# Patient Record
Sex: Female | Born: 1940 | Race: Black or African American | Hispanic: No | Marital: Single | Smoking: Current every day smoker
Health system: Southern US, Community
[De-identification: ages and names within clinical notes are randomized; demographics above are authoritative.]

## PROBLEM LIST (undated history)

## (undated) DIAGNOSIS — F419 Anxiety disorder, unspecified: Secondary | ICD-10-CM

## (undated) DIAGNOSIS — M1990 Unspecified osteoarthritis, unspecified site: Secondary | ICD-10-CM

## (undated) DIAGNOSIS — J449 Chronic obstructive pulmonary disease, unspecified: Secondary | ICD-10-CM

## (undated) DIAGNOSIS — J40 Bronchitis, not specified as acute or chronic: Secondary | ICD-10-CM

## (undated) DIAGNOSIS — I10 Essential (primary) hypertension: Secondary | ICD-10-CM

## (undated) DIAGNOSIS — F329 Major depressive disorder, single episode, unspecified: Secondary | ICD-10-CM

## (undated) DIAGNOSIS — F32A Depression, unspecified: Secondary | ICD-10-CM

## (undated) HISTORY — PX: CATARACT EXTRACTION, BILATERAL: SHX1313

---

## 2001-04-15 ENCOUNTER — Ambulatory Visit (HOSPITAL_COMMUNITY): Admission: RE | Admit: 2001-04-15 | Discharge: 2001-04-15 | Payer: Self-pay | Admitting: Unknown Physician Specialty

## 2001-04-15 ENCOUNTER — Encounter: Payer: Self-pay | Admitting: Unknown Physician Specialty

## 2001-11-16 ENCOUNTER — Ambulatory Visit (HOSPITAL_COMMUNITY): Admission: RE | Admit: 2001-11-16 | Discharge: 2001-11-16 | Payer: Self-pay | Admitting: Unknown Physician Specialty

## 2001-11-16 ENCOUNTER — Encounter: Payer: Self-pay | Admitting: Unknown Physician Specialty

## 2001-12-22 ENCOUNTER — Ambulatory Visit (HOSPITAL_COMMUNITY): Admission: RE | Admit: 2001-12-22 | Discharge: 2001-12-22 | Payer: Self-pay | Admitting: Unknown Physician Specialty

## 2001-12-22 ENCOUNTER — Encounter: Payer: Self-pay | Admitting: Unknown Physician Specialty

## 2002-06-03 ENCOUNTER — Ambulatory Visit (HOSPITAL_COMMUNITY): Admission: RE | Admit: 2002-06-03 | Discharge: 2002-06-03 | Payer: Self-pay | Admitting: General Surgery

## 2002-06-08 ENCOUNTER — Encounter: Payer: Self-pay | Admitting: General Surgery

## 2002-06-08 ENCOUNTER — Ambulatory Visit (HOSPITAL_COMMUNITY): Admission: RE | Admit: 2002-06-08 | Discharge: 2002-06-08 | Payer: Self-pay | Admitting: General Surgery

## 2004-01-31 ENCOUNTER — Ambulatory Visit: Payer: Self-pay | Admitting: Orthopedic Surgery

## 2004-02-12 ENCOUNTER — Ambulatory Visit: Payer: Self-pay | Admitting: Orthopedic Surgery

## 2005-03-24 ENCOUNTER — Ambulatory Visit (HOSPITAL_COMMUNITY): Admission: RE | Admit: 2005-03-24 | Discharge: 2005-03-24 | Payer: Self-pay | Admitting: Internal Medicine

## 2005-06-17 ENCOUNTER — Ambulatory Visit (HOSPITAL_COMMUNITY): Admission: RE | Admit: 2005-06-17 | Discharge: 2005-06-17 | Payer: Self-pay | Admitting: Internal Medicine

## 2008-01-07 HISTORY — PX: TOTAL KNEE ARTHROPLASTY: SHX125

## 2008-01-11 ENCOUNTER — Inpatient Hospital Stay (HOSPITAL_COMMUNITY): Admission: EM | Admit: 2008-01-11 | Discharge: 2008-01-19 | Payer: Self-pay | Admitting: Emergency Medicine

## 2008-01-20 ENCOUNTER — Emergency Department (HOSPITAL_COMMUNITY): Admission: EM | Admit: 2008-01-20 | Discharge: 2008-01-20 | Payer: Self-pay | Admitting: Emergency Medicine

## 2008-01-31 ENCOUNTER — Ambulatory Visit (HOSPITAL_COMMUNITY): Admission: RE | Admit: 2008-01-31 | Discharge: 2008-01-31 | Payer: Self-pay | Admitting: Internal Medicine

## 2008-06-26 ENCOUNTER — Ambulatory Visit (HOSPITAL_COMMUNITY): Admission: RE | Admit: 2008-06-26 | Discharge: 2008-06-26 | Payer: Self-pay | Admitting: Internal Medicine

## 2008-07-04 ENCOUNTER — Ambulatory Visit (HOSPITAL_COMMUNITY): Admission: RE | Admit: 2008-07-04 | Discharge: 2008-07-04 | Payer: Self-pay | Admitting: Internal Medicine

## 2008-07-11 ENCOUNTER — Inpatient Hospital Stay (HOSPITAL_COMMUNITY): Admission: RE | Admit: 2008-07-11 | Discharge: 2008-07-14 | Payer: Self-pay | Admitting: Orthopedic Surgery

## 2010-04-14 LAB — BASIC METABOLIC PANEL
CO2: 28 mEq/L (ref 19–32)
CO2: 29 mEq/L (ref 19–32)
Calcium: 8.5 mg/dL (ref 8.4–10.5)
Chloride: 104 mEq/L (ref 96–112)
Chloride: 104 mEq/L (ref 96–112)
Creatinine, Ser: 0.79 mg/dL (ref 0.4–1.2)
Creatinine, Ser: 0.88 mg/dL (ref 0.4–1.2)
GFR calc Af Amer: >60 mL/min (ref >60)
GFR calc Af Amer: >60 mL/min (ref >60)
Glucose, Bld: 121 mg/dL — ABNORMAL HIGH (ref 70–99)
Sodium: 137 mEq/L (ref 135–145)
Sodium: 137 mEq/L (ref 135–145)

## 2010-04-14 LAB — CBC
Hemoglobin: 11 g/dL — ABNORMAL LOW (ref 12.0–15.0)
Hemoglobin: 11.8 g/dL — ABNORMAL LOW (ref 12.0–15.0)
MCHC: 33.5 g/dL (ref 30.0–36.0)
MCHC: 33.8 g/dL (ref 30.0–36.0)
MCV: 83.7 fL (ref 78.0–100.0)
MCV: 85.5 fL (ref 78.0–100.0)
RBC: 3.9 MIL/uL (ref 3.87–5.11)
RBC: 4.12 MIL/uL (ref 3.87–5.11)
RDW: 16.7 % — ABNORMAL HIGH (ref 11.5–15.5)
WBC: 12.7 10*3/uL — ABNORMAL HIGH (ref 4.0–10.5)

## 2010-04-14 LAB — TYPE AND SCREEN

## 2010-04-15 LAB — URINALYSIS, ROUTINE W REFLEX MICROSCOPIC
Bilirubin Urine: NEGATIVE
Ketones, ur: NEGATIVE mg/dL
Nitrite: NEGATIVE
Protein, ur: NEGATIVE mg/dL
pH: 6 (ref 5.0–8.0)

## 2010-04-15 LAB — APTT: aPTT: 30 seconds (ref 24–37)

## 2010-04-15 LAB — DIFFERENTIAL
Basophils Absolute: 0.3 10*3/uL — ABNORMAL HIGH (ref 0.0–0.1)
Basophils Relative: 2 % — ABNORMAL HIGH (ref 0–1)
Lymphocytes Relative: 21 % (ref 12–46)
Monocytes Absolute: 0.5 10*3/uL (ref 0.1–1.0)
Neutro Abs: 8.8 10*3/uL — ABNORMAL HIGH (ref 1.7–7.7)
Neutrophils Relative %: 73 % (ref 43–77)

## 2010-04-15 LAB — BASIC METABOLIC PANEL
CO2: 27 mEq/L (ref 19–32)
Calcium: 9.7 mg/dL (ref 8.4–10.5)
Creatinine, Ser: 1.06 mg/dL (ref 0.4–1.2)
GFR calc non Af Amer: 51 mL/min — ABNORMAL LOW (ref >60)
Glucose, Bld: 157 mg/dL — ABNORMAL HIGH (ref 70–99)

## 2010-04-15 LAB — CBC
MCHC: 33.5 g/dL (ref 30.0–36.0)
Platelets: 424 10*3/uL — ABNORMAL HIGH (ref 150–400)
RDW: 16.7 % — ABNORMAL HIGH (ref 11.5–15.5)

## 2010-04-15 LAB — URINE MICROSCOPIC-ADD ON

## 2010-04-15 LAB — PROTIME-INR
INR: 1 (ref 0.00–1.49)
Prothrombin Time: 13 seconds (ref 11.6–15.2)

## 2010-04-22 LAB — CBC
HCT: 31.1 % — ABNORMAL LOW (ref 36.0–46.0)
HCT: 32.6 % — ABNORMAL LOW (ref 36.0–46.0)
HCT: 43 % (ref 36.0–46.0)
Hemoglobin: 10.4 g/dL — ABNORMAL LOW (ref 12.0–15.0)
Hemoglobin: 10.5 g/dL — ABNORMAL LOW (ref 12.0–15.0)
Hemoglobin: 11.2 g/dL — ABNORMAL LOW (ref 12.0–15.0)
Hemoglobin: 11.7 g/dL — ABNORMAL LOW (ref 12.0–15.0)
Hemoglobin: 14.5 g/dL (ref 12.0–15.0)
MCHC: 33.5 g/dL (ref 30.0–36.0)
MCHC: 33.7 g/dL (ref 30.0–36.0)
MCHC: 34.2 g/dL (ref 30.0–36.0)
MCV: 85.3 fL (ref 78.0–100.0)
MCV: 85.4 fL (ref 78.0–100.0)
MCV: 85.7 fL (ref 78.0–100.0)
MCV: 86.4 fL (ref 78.0–100.0)
MCV: 86.8 fL (ref 78.0–100.0)
Platelets: 439 10*3/uL — ABNORMAL HIGH (ref 150–400)
Platelets: 481 10*3/uL — ABNORMAL HIGH (ref 150–400)
RBC: 3.6 MIL/uL — ABNORMAL LOW (ref 3.87–5.11)
RBC: 3.63 MIL/uL — ABNORMAL LOW (ref 3.87–5.11)
RBC: 4.06 MIL/uL (ref 3.87–5.11)
RBC: 4.07 MIL/uL (ref 3.87–5.11)
RDW: 14.8 % (ref 11.5–15.5)
RDW: 14.8 % (ref 11.5–15.5)
RDW: 15.2 % (ref 11.5–15.5)
WBC: 16.6 10*3/uL — ABNORMAL HIGH (ref 4.0–10.5)
WBC: 18.4 10*3/uL — ABNORMAL HIGH (ref 4.0–10.5)

## 2010-04-22 LAB — BASIC METABOLIC PANEL
BUN: 3 mg/dL — ABNORMAL LOW (ref 6–23)
BUN: 5 mg/dL — ABNORMAL LOW (ref 6–23)
BUN: 6 mg/dL (ref 6–23)
BUN: 7 mg/dL (ref 6–23)
CO2: 27 mEq/L (ref 19–32)
CO2: 27 mEq/L (ref 19–32)
Calcium: 8.4 mg/dL (ref 8.4–10.5)
Calcium: 8.7 mg/dL (ref 8.4–10.5)
Chloride: 102 mEq/L (ref 96–112)
Chloride: 104 mEq/L (ref 96–112)
Chloride: 104 mEq/L (ref 96–112)
Creatinine, Ser: 0.75 mg/dL (ref 0.4–1.2)
Creatinine, Ser: 0.77 mg/dL (ref 0.4–1.2)
GFR calc Af Amer: >60 mL/min (ref >60)
GFR calc Af Amer: >60 mL/min (ref >60)
GFR calc Af Amer: >60 mL/min (ref >60)
GFR calc non Af Amer: 60 mL/min — ABNORMAL LOW (ref >60)
GFR calc non Af Amer: >60 mL/min (ref >60)
GFR calc non Af Amer: >60 mL/min (ref >60)
GFR calc non Af Amer: >60 mL/min (ref >60)
Glucose, Bld: 111 mg/dL — ABNORMAL HIGH (ref 70–99)
Glucose, Bld: 149 mg/dL — ABNORMAL HIGH (ref 70–99)
Potassium: 3 mEq/L — ABNORMAL LOW (ref 3.5–5.1)
Potassium: 3.6 mEq/L (ref 3.5–5.1)
Potassium: 4.5 mEq/L (ref 3.5–5.1)
Sodium: 137 mEq/L (ref 135–145)
Sodium: 138 mEq/L (ref 135–145)
Sodium: 139 mEq/L (ref 135–145)

## 2010-04-22 LAB — DIFFERENTIAL
Basophils Absolute: 0 10*3/uL (ref 0.0–0.1)
Basophils Absolute: 0 10*3/uL (ref 0.0–0.1)
Basophils Absolute: 0 10*3/uL (ref 0.0–0.1)
Basophils Relative: 0 % (ref 0–1)
Basophils Relative: 0 % (ref 0–1)
Basophils Relative: 0 % (ref 0–1)
Basophils Relative: 1 % (ref 0–1)
Eosinophils Absolute: 0 10*3/uL (ref 0.0–0.7)
Eosinophils Absolute: 0 10*3/uL (ref 0.0–0.7)
Eosinophils Absolute: 0.1 10*3/uL (ref 0.0–0.7)
Eosinophils Absolute: 0.1 10*3/uL (ref 0.0–0.7)
Eosinophils Absolute: 0.3 10*3/uL (ref 0.0–0.7)
Eosinophils Relative: 0 % (ref 0–5)
Eosinophils Relative: 0 % (ref 0–5)
Eosinophils Relative: 0 % (ref 0–5)
Eosinophils Relative: 1 % (ref 0–5)
Eosinophils Relative: 2 % (ref 0–5)
Lymphocytes Relative: 20 % (ref 12–46)
Lymphocytes Relative: 8 % — ABNORMAL LOW (ref 12–46)
Lymphs Abs: 1.5 10*3/uL (ref 0.7–4.0)
Monocytes Absolute: 0.1 10*3/uL (ref 0.1–1.0)
Monocytes Absolute: 0.2 10*3/uL (ref 0.1–1.0)
Monocytes Absolute: 0.4 10*3/uL (ref 0.1–1.0)
Monocytes Relative: 1 % — ABNORMAL LOW (ref 3–12)
Monocytes Relative: 4 % (ref 3–12)
Neutro Abs: 10 10*3/uL — ABNORMAL HIGH (ref 1.7–7.7)
Neutro Abs: 18.3 10*3/uL — ABNORMAL HIGH (ref 1.7–7.7)
Neutrophils Relative %: 74 % (ref 43–77)
Neutrophils Relative %: 81 % — ABNORMAL HIGH (ref 43–77)
Neutrophils Relative %: 86 % — ABNORMAL HIGH (ref 43–77)
WBC Morphology: INCREASED

## 2010-04-22 LAB — BLOOD GAS, ARTERIAL
Acid-Base Excess: 5.1 mmol/L — ABNORMAL HIGH (ref 0.0–2.0)
Acid-Base Excess: 0.8 mmol/L (ref 0.0–2.0)
Acid-base deficit: 0.3 mmol/L (ref 0.0–2.0)
Bicarbonate: 23.3 mEq/L (ref 20.0–24.0)
Bicarbonate: 23.9 mEq/L (ref 20.0–24.0)
Bicarbonate: 29.6 mEq/L — ABNORMAL HIGH (ref 20.0–24.0)
O2 Content: 3 L/min
O2 Content: 3 L/min
O2 Saturation: 91.7 %
O2 Saturation: 93.6 %
O2 Saturation: 95.5 %
O2 Saturation: 95.2 %
Patient temperature: 37
Patient temperature: 37
TCO2: 26.6 mmol/L (ref 0–100)
TCO2: 21.5 mmol/L (ref 0–100)
pCO2 arterial: 37.8 mmHg (ref 35.0–45.0)
pCO2 arterial: 48 mmHg — ABNORMAL HIGH (ref 35.0–45.0)
pH, Arterial: 7.406 — ABNORMAL HIGH (ref 7.350–7.400)
pO2, Arterial: 64.1 mmHg — ABNORMAL LOW (ref 80.0–100.0)
pO2, Arterial: 82.1 mmHg (ref 80.0–100.0)

## 2010-04-22 LAB — COMPREHENSIVE METABOLIC PANEL
ALT: 44 U/L — ABNORMAL HIGH (ref 0–35)
AST: 38 U/L — ABNORMAL HIGH (ref 0–37)
Alkaline Phosphatase: 198 U/L — ABNORMAL HIGH (ref 39–117)
BUN: 9 mg/dL (ref 6–23)
CO2: 29 mEq/L (ref 19–32)
Chloride: 100 mEq/L (ref 96–112)
Chloride: 101 mEq/L (ref 96–112)
Creatinine, Ser: 0.81 mg/dL (ref 0.4–1.2)
GFR calc Af Amer: >60 mL/min (ref >60)
GFR calc non Af Amer: >60 mL/min (ref >60)
Glucose, Bld: 150 mg/dL — ABNORMAL HIGH (ref 70–99)
Glucose, Bld: 166 mg/dL — ABNORMAL HIGH (ref 70–99)
Potassium: 3.3 mEq/L — ABNORMAL LOW (ref 3.5–5.1)
Total Bilirubin: 0.4 mg/dL (ref 0.3–1.2)
Total Bilirubin: 0.6 mg/dL (ref 0.3–1.2)
Total Protein: 8 g/dL (ref 6.0–8.3)

## 2010-04-22 LAB — D-DIMER, QUANTITATIVE: D-Dimer, Quant: 2.7 ug/mL-FEU — ABNORMAL HIGH (ref 0.00–0.48)

## 2010-05-21 NOTE — H&P (Signed)
NAME:  Kathryn Long, Kathryn Long            ACCOUNT NO.:  [id number]   MEDICAL RECORD NO.:  [id number]          PATIENT TYPE:  INP   LOCATION:  A338                          FACILITY:  [facility]   PHYSICIAN:  Mila Homer. Sudie Bailey, M.D.DATE OF BIRTH:  1940-12-08   DATE OF ADMISSION:  01/11/2008  DATE OF DISCHARGE:  LH                              HISTORY & PHYSICAL   This 70 year old patient of Dr. Felecia Shelling was brought to the emergency room.  According her daughter, she has been coughing up a clearish sputum for  about 8 days gradually getting worse during this time.  She has had  fever and chills.  One time she had nausea and vomiting.  She had been  getting weaker.   She is currently on no medication.  She smoked since age 70/13,  currently smoking about a third to pack a day.  She complained of pains  to the abdomen and chest along with her symptomatology.  She really  finds very difficult to give history because she is very dyspneic and  using oxygen.   Admission exam showed a cooperative elderly woman whose blood pressure  is 159/84, the pulse 106, respiratory rate 20, temperature 97.1.  She  was on O2.  Mucous membranes are moist.  She appeared to be alert and  oriented, but again very dyspneic.  There are negative axillary and  supraclavicular nodes.  Lungs show decreased breath sounds throughout,  some prolonged expirations, mild wheezing particularly posteriorly.  There are mild intercostal retractions, mild use of accessory muscles  with respiration.  Heart had a regular rhythm, rate about 100, heart  sounds faint.  The abdomen was soft without organomegaly or mass or  tenderness.  There is no edema of the ankles.  Skin turgor was normal.   Her admission white cell count was 21,000 of which 87% neutrophils, 11  lymphs, hemoglobin 14.5.  Her CMP showed a potassium of 3.3, glucose  166, albumin 3.3, alk phos 198.  Her atypical lymphocytes and  mononuclear cells noted.   Chest x-ray showed  in the past medially the left infrahilar region,  which appeared to be posteriorly position, but lateral view consistent  with pneumonia involving the superior segment of left lower lobe,  neoplasm could not be excluded.  The heart is mildly enlarged.  There  were degenerative changes of the thoracic spine.   ADMISSION DIAGNOSES:  1. Presumptive pneumonia.  2. Tobacco use disorder.  3. Hypokalemia.  4. Hyperglycemia.  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   PLAN/TREATMENT:  Rocephin 1 g IV and Zithromax 500 mg IV.  She will have  an IV of normal saline 100 mL an hour in a regular diet.  She will be on  prophylactic Lovenox 40 mg subcu.  D-Dimer is pending and we will put  her on an NicoDerm 14 mg patch daily with O2 at 2 L and albuterol and  ipratropium neb treatments q.4 h. while awake q.2 h. p.r.n.  I will add  potassium KCl, potassium chloride 20 mEq daily.  We will recheck a CBC,  BMP in the morning.  Pulse ox to make sure O2 sats greater than 90%.  Discussed all this with the patient and her daughter.      Mila Homer. Sudie Bailey, M.D.  Electronically Signed     SDK/MEDQ  D:  01/11/2008  T:  01/12/2008  Job:  [id number]

## 2010-05-21 NOTE — H&P (Signed)
NAME:  Kathryn Long, Kathryn Long            ACCOUNT NO.:  [id number]   MEDICAL RECORD NO.:  [id number]           PATIENT TYPE:   LOCATION:                                 FACILITY:   PHYSICIAN:  Madlyn Frankel. Charlann Boxer, M.D.  DATE OF BIRTH:  [birth year]   DATE OF ADMISSION:  07/11/2008  DATE OF DISCHARGE:                              HISTORY & PHYSICAL   PROCEDURE:  Right total knee replacement.   CHIEF COMPLAINT:  Right knee pain.   HISTORY OF PRESENT ILLNESS:  A 70 year old female with a history of  right knee pain secondary to osteoarthritis.  It is refractory to all  conservative treatment.   PRIMARY CARE PHYSICIAN:  Tesfaye D. Felecia Shelling, MD   PAST MEDICAL HISTORY:  1. Osteoarthritis.  2. Depression.  3. Cataracts.   PAST SURGICAL HISTORY:  None.   FAMILY HISTORY:  Diabetes, coronary artery disease, cancer.   SOCIAL HISTORY:  She is a smoker, smokes 1 pack per day for the past 59  years.  Primary caregiver is going to be her daughter, Kathryn Long,  postoperatively.   DRUG ALLERGIES:  No known drug allergies.   CURRENT MEDICATIONS:  1. Ultracet p.r.n.  2. Lexapro.  3. Xanax.  4. Celebrex 200 mg p.o. b.i.d. x2 weeks after surgery.   REVIEW OF SYSTEMS:  RESPIRATORY:  She has sore throat on exertion.  GI:  She has constipation.  MUSCULOSKELETAL:  She has joint pain, joint  swelling, muscular pain.  Otherwise see HPI.   PHYSICAL EXAMINATION:  VITAL SIGNS:  Pulse 72, respirations 16, blood  pressure 174/86 sitting left arm.  GENERAL:  Awake, alert and oriented.  HEENT:  Normocephalic.  NECK:  Supple.  No carotid bruits.  CHEST:  Lungs clear to auscultation bilaterally.  BREASTS:  Deferred.  HEART:  S1, S2 distinct.  ABDOMEN:  Soft, nontender.  Bowel sounds present.  PELVIS:  Stable.  GENITOURINARY:  Deferred.  EXTREMITIES:  Right knee in varus alignment.  SKIN:  No cellulitis.  NEUROLOGICAL:  Intact distal sensibilities.   LABORATORY DATA:  Labs, EKG, chest x-ray pending.   IMPRESSION:  Right knee osteoarthritis.   PLAN OF ACTION:  Right total knee replacement by surgeon, Dr. Madlyn Frankel.  Charlann Boxer, at [facility] on July 11, 2008.  Risks and complications  were discussed.  Postoperative medications were provided including  aspirin for DVT prophylaxis.     ______________________________  Yetta Glassman [address], [place]      Madlyn Frankel. Charlann Boxer, M.D.  Electronically Signed    BLM/MEDQ  D:  06/30/2008  T:  06/30/2008  Job:  [id number]   cc:   Tesfaye D. Felecia Shelling, MD  Fax: [phone]

## 2010-05-21 NOTE — Op Note (Signed)
NAMEMarland Kitchen  KLOEY, CAZAREZ            ACCOUNT NO.:  [id number]   MEDICAL RECORD NO.:  [id number]          PATIENT TYPE:  INP   LOCATION:  1607                         FACILITY:  [facility]   PHYSICIAN:  Madlyn Frankel. Charlann Boxer, M.D.  DATE OF BIRTH:  1940/08/10   DATE OF PROCEDURE:  07/11/2008  DATE OF DISCHARGE:                               OPERATIVE REPORT   PREOPERATIVE DIAGNOSIS:  Right knee osteoarthritis.   POSTOPERATIVE DIAGNOSIS:  Right knee osteoarthritis.   PROCEDURE:  Right total knee replacement.   COMPONENTS USED:  DePuy rotating platform posterior stabilized knee  system, size 2.5-femur, 2.5-tibia, with 12.5-insert to match the 2.5-  femur and 35-patellar button.   SURGEON:  Dr. Charlann Boxer.   ASSISTANT:  Dwyane Luo, PA-C.   ANESTHESIA:  Spinal.   BLOOD LOSS:  Minimal.   TOURNIQUET TIME:  38 minutes at 250 mmHg.   DRAINS:  One Hemovac.   SPECIMENS:  None.   COMPLICATIONS:  None.   INDICATION FOR PROCEDURE:  Ms. Susann Givens is a 70 year old female who  presented to the office with bilateral knee pain, right greater than  left.  Radiographs revealed end-stage bone-on-bone changes.   Evaluating the radiographs and trying conservative measures, she failed  to respond in a satisfactory way.  Quality of life was diminished.  We  discussed the risks and benefits, pros and cons of knee replacement  surgery, including the risk of infection, DVT, component failure, need  for revision, including potential for manipulation due to stiffness,  postop course, and expectation.  Consent was obtained for the benefit of  pain relief for right total knee.   PROCEDURE IN DETAIL:  The patient was brought to the operating theater.  Once adequate anesthesia and preoperative antibiotics, Ancef, were  administered the patient was positioned supine and the right thigh  tourniquet placed.  Right lower extremity was prescrubbed, prepped and  draped in sterile fashion.   The time-out was performed  identifying the patient and the procedure and  extremity.  The leg was exsanguinated and tourniquet elevated to 2 mmHg.  A midline incision was made, followed by a median parapatellar  arthrotomy.  Following initial debridement attention was directed to the  patella and precut measurement was noted to be 20-40 mm.  I resected  down to 14 mm and chose to use a 35-patellar button due to the cup size.  A metal shim was placed in the lug holes that had been drilled.  Attention was now directed to the femur and femoral retractors were  placed and a drill was used to open up the femur.  I irrigated the femur  out to prevent fat emboli and then placed intramedullary rod at 3  degrees of valgus.  I resected 10 mm of bone off the distal femur.   Following this attention was directed to the tibia.  I utilized the  extramedullary guide and then made a resection of 10 mm about the  proximal lateral tibia.  We checked the cut surface to make sure it was  perpendicular as well as the 10-mm extension block.   Once I was able to  determine a coronal plane cut was perpendicular, I  cut the rotation of the femur based off this proximal tibia cut.  It was  felt the femur would be a size 2.5.  The 2.5-rotation block was then  pinned into position with a C clamp.  I then exchanged this out with a 4-  and-1 cutting block and made the anterior-posterior chamfer cuts.  This  was done without notching or complication.  The box cut was made for the  distal bone forearm and lateral aspect of distal femur.  Attention was  now redirected back to the tibia and subluxated anteriorly.  The cut  surface seemed to fit best in the 2.5-tibia.  This was pinned into  position, drilled and keel punched.  Trial reduction was carried out  with the return tibia with 12.5-insert and the knee came to full  extension with stable ligaments from extension and flexion with the  patella tracking through the trochlea without application  pressure.  All  trial components were removed and the final components were opened.  Cement was mixed and the knee was injected at the synovial capsule  junction with 0.25% Marcaine with epinephrine and 1 mL of Toradol.  The  knee was irrigated with normal saline solution.  Once the cement was  ready and the cut surfaces dried, the final components were cemented  into position.  Extruded cement was removed as the knee was brought to  extension with a 12.5-insert.  Once the cement had cured excessive  cement was removed throughout the knee.  Once I was satisfied there was  no remaining cement into the knee, the final 12.5-insert matched and  then femur was inserted.  The knee was reirrigated and a medium Hemovac  drain was placed deep.  The extensor mechanism was then reapproximated  using 1-Vicryl.  The tourniquet was let down at 38 minutes.  The  remainder of the wound was closed with 2-0 Vicryl and running Monocryl  and the wound was cleaned, dried and dressed sterilely with Steri-Strips  and bulky sterile wrap.  She was brought to recovery in stable condition  having tolerating the procedure well.      Madlyn Frankel Charlann Boxer, M.D.  Electronically Signed     MDO/MEDQ  D:  07/11/2008  T:  07/11/2008  Job:  [id number]

## 2010-05-24 NOTE — Discharge Summary (Signed)
NAME:  Kathryn Long, Kathryn Long            ACCOUNT NO.:  [id number]   MEDICAL RECORD NO.:  [id number]          PATIENT TYPE:  INP   LOCATION:  A338                          FACILITY:  [facility]   PHYSICIAN:  Tesfaye D. Felecia Shelling, MD   DATE OF BIRTH:  [birth year]   DATE OF ADMISSION:  01/11/2008  DATE OF DISCHARGE:  01/13/2010LH                               DISCHARGE SUMMARY   DISCHARGE DIAGNOSES:  1. Pneumonia.  2. Pulmonary nodules, etiology not clear.  3. Hypokalemia.  4. Dyspepsia.  5. Constipation.  6. Back pain.  7. Probable chronic obstructive pulmonary disease.   DISCHARGE MEDICATIONS:  1. Augmentin 500 mg p.o. t.i.d.  2. Combivent inhaler two puffs p.o. t.i.d.  3. Mucinex 600 mg one tablet p.o. q.12 h p.r.n.  4. Tylenol 650 one tablet p.o. q.6 h p.r.n.   DISPOSITION:  The patient was discharged to home in stable condition.   HOSPITAL COURSE:  This is a 70 year old female patient who was admitted  on January 11, 2008 due to cough and shortness of breath.  Her chest x-  ray showed signs of pneumonia and pleural effusion.  The patient has a  history of long-term tobacco abuse.  She was admitted and started on IV  antibiotics.  However, the patient also started having nausea, vomiting  and difficulty breathing with wheezing.  She had symptoms compatible  with chronic obstructive pulmonary disease.  She was started on  nebulizer treatment and IV steroids.  The patient was continued on IV  antibiotics.  Over the hospital stay the patient gradually improved and  she was discharged to home to be followed as an outpatient.      Tesfaye D. Felecia Shelling, MD  Electronically Signed     TDF/MEDQ  D:  02/18/2008  T:  02/18/2008  Job:  [id number]

## 2010-05-24 NOTE — Discharge Summary (Signed)
NAMEMarland Long  TALIAH, PORCHE            ACCOUNT NO.:  [id number]   MEDICAL RECORD NO.:  [id number]          PATIENT TYPE:  INP   LOCATION:  1607                         [facility]ACILITY:  [facility]   PHYSICIAN:  Madlyn Frankel. Charlann Boxer, M.D.  DATE OF BIRTH:  January 02, 1941   DATE OF ADMISSION:  07/11/2008  DATE OF DISCHARGE:  07/14/2008                               DISCHARGE SUMMARY   ADMITTING DIAGNOSES:  1. Osteoarthritis.  2. Depression.  3. Cataracts.   DISCHARGE DIAGNOSES:  1. Osteoarthritis.  2. Depression.  3. Cataracts.   The patient is a 70 year old female with history of right knee pain  secondary to osteoarthritis refractory to surgery.   PROCEDURE:  Right total knee replacement.  Surgeon - Dr. Durene Romans.  Assistant - Dwyane Luo, PA-C.   CONSULTATIONS:  None.   LABORATORY DATA:  CBC final reading - white blood cells 11.7, hematocrit  32.6, platelets 332.  Metabolic - sodium 137, potassium 3.3, BUN 8,  creatinine 0.79, glucose __________.   HOSPITAL COURSE:  The patient was admitted to the hospital for a right  total knee replacement and admitted to the orthopedic floor.  Her stay  was unremarkable.  She remained hemodynamically orthopedically stable.  She did have some pain control issues which were modified by replacing  Darvocet with oxycodone.  Oxycodone was sufficient for pain control.  The dressing was changed on day two.  No significant drainage from the  wound.  She remained neurovascularly intact of her right lower extremity  with improving quad function throughout.  She was weightbearing as  tolerated with physical therapy.  By day three, she had met all criteria  for discharge home with pain well-controlled with oxycodone, in stable  and improved condition.   DISCHARGE DISPOSITION:  Discharged home in stable and improved condition  with home health care PT.   DISCHARGE PHYSICAL THERAPY:  Weightbearing as tolerated.  Goals of range  of motion zero to 120 by 6 weeks.   DISCHARGE DIET:  Heart-healthy.   WOUND CARE:  Keep dry.   DISCHARGE MEDICATIONS:  1. Oxycodone 5 mg one to three p.o. q.3-4h. p.r.n. pain.  2. Tylenol 1000 mg p.o. q.6h.  3. Robaxin 500 mg one p.o. q.6h.  4. Iron 325 mg p.o. t.i.d.  5. Colace 100 grams p.o. b.i.d.  6. MiraLax 17 grams p.o. daily.  7. Aspirin 325 mg p.o. b.i.d. x6 weeks.  8. Lisinopril HCTZ one p.o. daily.  9. Combivent two puffs up to four times daily.  10.Requip 1 mg one t.i.d.  11.Lexapro 20 mg p.o. daily.  12.Alprazolam 0.5 mg p.o. b.i.d.   DISCHARGE FOLLOWUP:  Follow up at phone number [phone] with Dr. Charlann Boxer  for a 2-week wound check.     ______________________________  Yetta Glassman. [address], [place]      Madlyn Frankel. Charlann Boxer, M.D.  Electronically Signed    BLM/MEDQ  D:  07/27/2008  T:  07/27/2008  Job:  [id number]   cc:   Tesfaye D. Felecia Shelling, MD  Fax: [phone]

## 2011-04-08 ENCOUNTER — Ambulatory Visit (HOSPITAL_COMMUNITY)
Admission: RE | Admit: 2011-04-08 | Discharge: 2011-04-08 | Disposition: A | Discharge status: Home / Self Care | Payer: Medicare Other | Source: Ambulatory Visit | Attending: Internal Medicine | Admitting: Internal Medicine

## 2011-04-08 ENCOUNTER — Other Ambulatory Visit (HOSPITAL_COMMUNITY): Payer: Self-pay | Admitting: Internal Medicine

## 2011-04-08 DIAGNOSIS — M25511 Pain in right shoulder: Secondary | ICD-10-CM

## 2011-04-08 DIAGNOSIS — M25519 Pain in unspecified shoulder: Secondary | ICD-10-CM | POA: Insufficient documentation

## 2011-09-02 ENCOUNTER — Other Ambulatory Visit (HOSPITAL_COMMUNITY): Payer: Self-pay | Admitting: Internal Medicine

## 2011-09-02 DIAGNOSIS — Z139 Encounter for screening, unspecified: Secondary | ICD-10-CM

## 2011-09-15 ENCOUNTER — Ambulatory Visit (HOSPITAL_COMMUNITY): Payer: Medicare Other

## 2011-10-15 ENCOUNTER — Telehealth: Payer: Self-pay

## 2011-10-15 NOTE — Telephone Encounter (Signed)
Called pt. She does not want to do at this time. She is not having any problems. She needs to check on transportation and she is just not ready at this time. She will call when she is ready. Sending a letter to Dr. Felecia Shelling.

## 2011-11-04 ENCOUNTER — Other Ambulatory Visit (HOSPITAL_COMMUNITY): Payer: Self-pay | Admitting: Internal Medicine

## 2011-11-04 DIAGNOSIS — Z139 Encounter for screening, unspecified: Secondary | ICD-10-CM

## 2011-11-07 ENCOUNTER — Ambulatory Visit (HOSPITAL_COMMUNITY)
Admission: RE | Admit: 2011-11-07 | Discharge: 2011-11-07 | Disposition: A | Discharge status: Home / Self Care | Payer: Medicare Other | Source: Ambulatory Visit | Attending: Internal Medicine | Admitting: Internal Medicine

## 2011-11-07 DIAGNOSIS — Z139 Encounter for screening, unspecified: Secondary | ICD-10-CM

## 2011-11-07 DIAGNOSIS — Z1231 Encounter for screening mammogram for malignant neoplasm of breast: Secondary | ICD-10-CM | POA: Insufficient documentation

## 2011-11-12 ENCOUNTER — Other Ambulatory Visit: Payer: Self-pay | Admitting: Internal Medicine

## 2011-11-12 DIAGNOSIS — R928 Other abnormal and inconclusive findings on diagnostic imaging of breast: Secondary | ICD-10-CM

## 2011-11-26 ENCOUNTER — Ambulatory Visit (HOSPITAL_COMMUNITY)
Admission: RE | Admit: 2011-11-26 | Discharge: 2011-11-26 | Disposition: A | Discharge status: Home / Self Care | Payer: Medicare Other | Source: Ambulatory Visit | Attending: Internal Medicine | Admitting: Internal Medicine

## 2011-11-26 ENCOUNTER — Other Ambulatory Visit: Payer: Self-pay | Admitting: Internal Medicine

## 2011-11-26 DIAGNOSIS — R928 Other abnormal and inconclusive findings on diagnostic imaging of breast: Secondary | ICD-10-CM

## 2012-11-19 ENCOUNTER — Other Ambulatory Visit (HOSPITAL_COMMUNITY): Payer: Self-pay | Admitting: Internal Medicine

## 2012-11-19 DIAGNOSIS — Z139 Encounter for screening, unspecified: Secondary | ICD-10-CM

## 2012-11-26 ENCOUNTER — Ambulatory Visit (HOSPITAL_COMMUNITY)
Admission: RE | Admit: 2012-11-26 | Discharge: 2012-11-26 | Disposition: A | Discharge status: Home / Self Care | Payer: Medicare Other | Source: Ambulatory Visit | Attending: Internal Medicine | Admitting: Internal Medicine

## 2012-11-26 DIAGNOSIS — Z139 Encounter for screening, unspecified: Secondary | ICD-10-CM

## 2012-11-26 DIAGNOSIS — Z1231 Encounter for screening mammogram for malignant neoplasm of breast: Secondary | ICD-10-CM | POA: Insufficient documentation

## 2013-02-23 ENCOUNTER — Encounter (HOSPITAL_COMMUNITY): Payer: Self-pay | Admitting: Emergency Medicine

## 2013-02-23 ENCOUNTER — Emergency Department (HOSPITAL_COMMUNITY)
Admission: EM | Admit: 2013-02-23 | Discharge: 2013-02-23 | Disposition: A | Discharge status: Home / Self Care | Payer: Medicare HMO | Attending: Emergency Medicine | Admitting: Emergency Medicine

## 2013-02-23 DIAGNOSIS — F39 Unspecified mood [affective] disorder: Secondary | ICD-10-CM

## 2013-02-23 DIAGNOSIS — F411 Generalized anxiety disorder: Secondary | ICD-10-CM | POA: Insufficient documentation

## 2013-02-23 DIAGNOSIS — F17200 Nicotine dependence, unspecified, uncomplicated: Secondary | ICD-10-CM | POA: Insufficient documentation

## 2013-02-23 DIAGNOSIS — I10 Essential (primary) hypertension: Secondary | ICD-10-CM | POA: Insufficient documentation

## 2013-02-23 DIAGNOSIS — Z8739 Personal history of other diseases of the musculoskeletal system and connective tissue: Secondary | ICD-10-CM | POA: Insufficient documentation

## 2013-02-23 DIAGNOSIS — F329 Major depressive disorder, single episode, unspecified: Secondary | ICD-10-CM | POA: Insufficient documentation

## 2013-02-23 HISTORY — DX: Depression, unspecified: F32.A

## 2013-02-23 HISTORY — DX: Essential (primary) hypertension: I10

## 2013-02-23 HISTORY — DX: Major depressive disorder, single episode, unspecified: F32.9

## 2013-02-23 HISTORY — DX: Anxiety disorder, unspecified: F41.9

## 2013-02-23 HISTORY — DX: Unspecified osteoarthritis, unspecified site: M19.90

## 2013-02-23 LAB — CBC WITH DIFFERENTIAL/PLATELET
BASOS ABS: 0 10*3/uL (ref 0.0–0.1)
Basophils Relative: 0 % (ref 0–1)
Eosinophils Absolute: 0.1 10*3/uL (ref 0.0–0.7)
Eosinophils Relative: 1 % (ref 0–5)
HEMATOCRIT: 38.9 % (ref 36.0–46.0)
Hemoglobin: 14 g/dL (ref 12.0–15.0)
LYMPHS PCT: 24 % (ref 12–46)
Lymphs Abs: 2.8 10*3/uL (ref 0.7–4.0)
MCH: 28.4 pg (ref 26.0–34.0)
MCHC: 36 g/dL (ref 30.0–36.0)
MCV: 78.9 fL (ref 78.0–100.0)
MONO ABS: 0.5 10*3/uL (ref 0.1–1.0)
Monocytes Relative: 4 % (ref 3–12)
NEUTROS ABS: 8.5 10*3/uL — AB (ref 1.7–7.7)
NEUTROS PCT: 71 % (ref 43–77)
PLATELETS: 455 10*3/uL — AB (ref 150–400)
RBC: 4.93 MIL/uL (ref 3.87–5.11)
RDW: 15.5 % (ref 11.5–15.5)
WBC: 12 10*3/uL — AB (ref 4.0–10.5)

## 2013-02-23 LAB — BASIC METABOLIC PANEL
BUN: 9 mg/dL (ref 6–23)
CHLORIDE: 102 meq/L (ref 96–112)
CO2: 25 meq/L (ref 19–32)
Calcium: 9.5 mg/dL (ref 8.4–10.5)
Creatinine, Ser: 1.01 mg/dL (ref 0.50–1.10)
GFR calc Af Amer: 62 mL/min — ABNORMAL LOW (ref >90)
GFR calc non Af Amer: 53 mL/min — ABNORMAL LOW (ref >90)
Glucose, Bld: 107 mg/dL — ABNORMAL HIGH (ref 70–99)
POTASSIUM: 4.2 meq/L (ref 3.7–5.3)
SODIUM: 140 meq/L (ref 137–147)

## 2013-02-23 LAB — RAPID URINE DRUG SCREEN, HOSP PERFORMED
Amphetamines: NOT DETECTED
BARBITURATES: NOT DETECTED
BENZODIAZEPINES: POSITIVE — AB
Cocaine: NOT DETECTED
Opiates: NOT DETECTED
Tetrahydrocannabinol: POSITIVE — AB

## 2013-02-23 LAB — URINALYSIS, ROUTINE W REFLEX MICROSCOPIC
Bilirubin Urine: NEGATIVE
Glucose, UA: NEGATIVE mg/dL
Hgb urine dipstick: NEGATIVE
Ketones, ur: NEGATIVE mg/dL
Nitrite: NEGATIVE
Protein, ur: NEGATIVE mg/dL
Specific Gravity, Urine: 1.01 (ref 1.005–1.030)
Urobilinogen, UA: 0.2 mg/dL (ref 0.0–1.0)
pH: 5.5 (ref 5.0–8.0)

## 2013-02-23 LAB — ETHANOL

## 2013-02-23 LAB — URINE MICROSCOPIC-ADD ON

## 2013-02-23 MED ORDER — ALPRAZOLAM 0.5 MG PO TABS
0.5000 mg | ORAL_TABLET | Freq: Once | ORAL | Status: DC
  Filled 2013-02-23: qty 1

## 2013-02-23 MED ORDER — ALPRAZOLAM 0.5 MG PO TABS: 0.5000 mg | ORAL_TABLET | Freq: Two times a day (BID) | ORAL | Status: DC | PRN

## 2013-02-23 MED ORDER — LORAZEPAM 1 MG PO TABS
1.0000 mg | ORAL_TABLET | Freq: Once | ORAL | Status: AC
  Administered 2013-02-23: 1 mg via ORAL
  Filled 2013-02-23: qty 1

## 2013-02-23 MED ORDER — ESCITALOPRAM OXALATE 10 MG PO TABS: 10.0000 mg | ORAL_TABLET | Freq: Every day | ORAL | Status: DC

## 2013-02-23 NOTE — ED Notes (Signed)
MD at bedside. 

## 2013-02-23 NOTE — ED Provider Notes (Signed)
CSN: [id number]     Arrival date & time 02/23/13  1516 History   First MD Initiated Contact with Patient 02/23/13 1634     Chief Complaint  Patient presents with  . Nausea  . Depression  . Anxiety     (Consider location/radiation/quality/duration/timing/severity/associated sxs/prior Treatment) Patient is a 73 y.o. female presenting with anxiety.  Anxiety   Pt with history of depression reports several months of worsening mood, anhedonia, and sleeplessness. She reports in the last several weeks, the death of her brother has exacerbated her symptoms significantly. She was previously taking Xanax which helped some but no longer doing much. She has been to see her PCP, but no other meds given. She has had some feelings of helplessness and a sense of 'being better off dead' but no active suicidal ideation or plan. She has not slept much in recent days, 30-83min a night. She has had poor PO intake and no appetite.   Past Medical History  Diagnosis Date  . Depression   . Anxiety   . Arthritis   . Hypertension    Past Surgical History  Procedure Laterality Date  . Knee surgery     No family history on file. History  Substance Use Topics  . Smoking status: Current Every Day Smoker  . Smokeless tobacco: Not on file  . Alcohol Use: No   OB History   Grav Para Term Preterm Abortions TAB SAB Ect Mult Living                 Review of Systems All other systems reviewed and are negative except as noted in HPI.     Allergies  Review of patient's allergies indicates no known allergies.  Home Medications  No current outpatient prescriptions on file. BP 195/58  Pulse 65  Temp(Src) 98.7 F (37.1 C) (Oral)  Resp 18  Ht 5' 3.5" (1.613 m)  Wt 178 lb (80.74 kg)  BMI 31.03 kg/m2  SpO2 95% Physical Exam  Nursing note and vitals reviewed. Constitutional: She is oriented to person, place, and time. She appears well-developed and well-nourished.  HENT:  Head: Normocephalic and  atraumatic.  Eyes: EOM are normal. Pupils are equal, round, and reactive to light.  Neck: Normal range of motion. Neck supple.  Cardiovascular: Normal rate, normal heart sounds and intact distal pulses.   Pulmonary/Chest: Effort normal and breath sounds normal.  Abdominal: Bowel sounds are normal. She exhibits no distension. There is no tenderness.  Musculoskeletal: Normal range of motion. She exhibits no edema and no tenderness.  Neurological: She is alert and oriented to person, place, and time. She has normal strength. No cranial nerve deficit or sensory deficit.  Skin: Skin is warm and dry. No rash noted.  Psychiatric:  Flat affect    ED Course  Procedures (including critical care time) Labs Review Labs Reviewed  URINALYSIS, ROUTINE W REFLEX MICROSCOPIC - Abnormal; Notable for the following:    Leukocytes, UA SMALL (*)    All other components within normal limits  URINE RAPID DRUG SCREEN (HOSP PERFORMED) - Abnormal; Notable for the following:    Benzodiazepines POSITIVE (*)    Tetrahydrocannabinol POSITIVE (*)    All other components within normal limits  CBC WITH DIFFERENTIAL - Abnormal; Notable for the following:    WBC 12.0 (*)    Platelets 455 (*)    Neutro Abs 8.5 (*)    All other components within normal limits  BASIC METABOLIC PANEL - Abnormal; Notable for the following:  Glucose, Bld 107 (*)    GFR calc non Af Amer 53 (*)    GFR calc Af Amer 62 (*)    All other components within normal limits  ETHANOL  URINE MICROSCOPIC-ADD ON   Imaging Review No results found.  EKG Interpretation   None       MDM   Final diagnoses:  MDD (major depressive disorder)  GAD (generalized anxiety disorder)    Pt assessed by Psychiatry who does not feel she needs inpatient treatment. He has recommended decreased Xanax dose as an initial wean from that medication and to initiate Lexapro to help with depressive as well as anxiety symptoms. Will need close outpatient followup  for long term management, given referrals to local outpatient psychiatric resources.     Charles B. Karle Starch, MD 02/23/13 2044

## 2013-02-23 NOTE — ED Notes (Signed)
Pt c/o feeling depressed, si, and nausea x 3 weeks.  Reports her brother recently passed away.

## 2013-02-23 NOTE — Consult Note (Signed)
Spoke briefly with Dr. Karle Starch who needed to call TTS back due to emergency.  TTS # [phone].  Will discuss case with MD.  Based on initial review by MD it appears pt will need to remain in ED overnight, have case mgt follow up in AM and secure apts for pt with PCP and therapist to work on depressive symptoms.  Psychiatry can also rouond on her in the AM too   TTS is always will to conduct an assessment and assist however.  MD to advise

## 2013-02-23 NOTE — ED Notes (Signed)
Ambulatory to restroom with steady gait.

## 2013-02-23 NOTE — Discharge Instructions (Signed)
Major Depressive Disorder °Major depressive disorder (MDD) is a mental illness. It also may be called clinical depression or unipolar depression. MDD usually causes feelings of sadness, hopelessness, or helplessness. Some people with MDD do not feel particularly sad but lose interest in doing things they used to enjoy (anhedonia). MDD also can cause physical symptoms. It can interfere with work, school, relationships, and other normal everyday activities. MDD varies in severity but is longer lasting and more serious than the sadness we all feel from time to time in our lives. °MDD often is triggered by stressful life events or major life changes. Examples of these triggers include divorce, loss of your job or home, a move, and the death of a family member or close friend. Sometimes MDD occurs for no obvious reason at all. People who have family members with MDD or bipolar disorder are at higher risk for developing MDD, with or without life stressors. MDD can occur at any age. It may occur just once in your life (single episode MDD). It may occur multiple times (recurrent MDD). °SYMPTOMS °People with MDD have either anhedonia or depressed mood on nearly a daily basis for at least 2 weeks or longer. Symptoms of depressed mood include: °· Feelings of sadness (blue or down in the dumps) or emptiness. °· Feelings of hopelessness or helplessness. °· Tearfulness or episodes of crying (may be observed by others). °· Irritability (children and adolescents). °In addition to depressed mood or anhedonia or both, people with MDD have at least four of the following symptoms: °· Difficulty sleeping or sleeping too much.   °· Significant change (increase or decrease) in appetite or weight.   °· Lack of energy or motivation. °· Feelings of guilt and worthlessness.   °· Difficulty concentrating, remembering, or making decisions. °· Unusually slow movement (psychomotor retardation) or restlessness (as observed by others).    °· Recurrent wishes for death, recurrent thoughts of self-harm (suicide), or a suicide attempt. °People with MDD commonly have persistent negative thoughts about themselves, other people, and the world. People with severe MDD may experience distorted beliefs or perceptions about the world (psychotic delusions). They also may see or hear things that are not real (psychotic hallucinations). °DIAGNOSIS °MDD is diagnosed through an assessment by your caregiver. Your caregiver will ask about aspects of your daily life, such as mood, sleep, and appetite, to see if you have the diagnostic symptoms of MDD. Your caregiver may ask about your medical history and use of alcohol or drugs, including prescription medications. Your caregiver also may do a physical exam and blood work. This is because certain medical conditions and the use of certain substances can cause MDD-like symptoms (secondary depression). Your caregiver also may refer you to a mental health specialist for further evaluation and treatment. °TREATMENT °It is important to recognize the symptoms of MDD and seek treatment. The following treatments can be prescribed for MDD:   °· Medication Antidepressant medications usually are prescribed. Antidepressant medications are thought to correct chemical imbalances in the brain that are commonly associated with MDD. Other types of medication may be added if MDD symptoms do not respond to antidepressant medications alone or if psychotic delusions or hallucinations occur. °· Talk therapy Talk therapy can be helpful in treating MDD by providing support, education, and guidance. Certain types of talk therapy also can help with negative thinking (cognitive behavioral therapy) and with relationship issues that trigger MDD (interpersonal therapy). °A mental health specialist can help determine which treatment is best for you. Most people with MDD do well with a   combination of medication and talk therapy. Treatments involving  electrical stimulation of the brain can be used in situations with extremely severe symptoms or when medication and talk therapy do not work over time. These treatments include electroconvulsive therapy, transcranial magnetic stimulation, and vagal nerve stimulation. °Document Released: 04/19/2012 Document Reviewed: 04/19/2012 °[employer]® Patient Information ©2014 [employer]. ° °

## 2013-02-23 NOTE — Consult Note (Signed)
Telepsych Consultation   Reason for Consult: Evaluation for IP psychiatric mgmt Referring Physician:  Karle Starch MD Kathryn Long is an 73 y.o. female.  Assessment: AXIS I:  Generalized Anxiety Disorder and Mood Disorder NOS AXIS II:  No diagnosis AXIS III:  H/o of ETOH abuse and recreational marijuana use Past Medical History  Diagnosis Date  . Depression   . Anxiety   . Arthritis   . Hypertension    AXIS IV:  other psychosocial or environmental problems AXIS V:  51-60 moderate symptoms  Plan:  Patient does not meet criteria for psychiatric inpatient admission. Will proceed with OP psychiatric mgmt with assistance of assessment team along with the implementation of psychotropic medication and anxiolytics prn.  Subjective:   Kathryn Long is a 73 y.o. female patient  HPI:  Pt is a 73 y/o AAF, presenting voluntarily and accompanied by her daughter. The patient is endorsing worsening depressive sx, coupled with her long standing GAD. Patient has been medically managed per her PCP Fanta MD, who has been prescribing Xanax 1 mg BID x 2 years. The patient rates her depressive sx 5/10 and her Anxiety sx a 8/10. Recent stressors include the passing of her brother two weeks ago and the placement of her mother in an assisted living center. The patient is endorsing decreased sleep, anorexia without quantifiable weight loss, racing thoughts and mood swings. The patient is denying SI/SA/HI,AVH, paranoia and or delusional thoughts. The patient states that she can contract for safety and denies any previous IP and or OP psychiatric evaluations/mgmt. The patient also denies any family hx of Mental illness but does endorse a prior hx of ETOH abuse many years ago, but has been clean, dry and sober. The patient does endorse occasional marihuana use. The patient denies hx of PTSD and or  panic attacks associated with her Dx GAD.     Past Psychiatric History: Past Medical History  Diagnosis Date  .  Depression   . Anxiety   . Arthritis   . Hypertension     reports that she has been smoking.  She does not have any smokeless tobacco history on file. She reports that she does not drink alcohol or use illicit drugs. No family history on file.       Allergies:  No Known Allergies  ACT Assessment Complete:  No:   Past Psychiatric History: Diagnosis:  GAD  Hospitalizations:  n/a  Outpatient Care:  n/a  Substance Abuse Care:  n/a  Self-Mutilation:  none  Suicidal Attempts:  none  Homicidal Behaviors:  none   Violent Behaviors: none   [facility] Marital Status: unknown Employed/Unemployed: retired, caregiver Education:  unknown Family Supports:  yes Objective: Blood pressure 195/58, pulse 65, temperature 98.7 F (37.1 C), temperature source Oral, resp. rate 18, height 5' 3.5" (1.613 m), weight 178 lb (80.74 kg), SpO2 95.00%.Body mass index is 31.03 kg/(m^2). Results for orders placed during the hospital encounter of 02/23/13 (from the past 72 hour(s))  CBC WITH DIFFERENTIAL     Status: Abnormal   Collection Time    02/23/13  4:37 PM      Result Value Ref Range   WBC 12.0 (*) 4.0 - 10.5 K/uL   RBC 4.93  3.87 - 5.11 MIL/uL   Hemoglobin 14.0  12.0 - 15.0 g/dL   HCT 38.9  36.0 - 46.0 %   MCV 78.9  78.0 - 100.0 fL   MCH 28.4  26.0 - 34.0 pg  MCHC 36.0  30.0 - 36.0 g/dL   RDW 15.5  11.5 - 15.5 %   Platelets 455 (*) 150 - 400 K/uL   Neutrophils Relative % 71  43 - 77 %   Neutro Abs 8.5 (*) 1.7 - 7.7 K/uL   Lymphocytes Relative 24  12 - 46 %   Lymphs Abs 2.8  0.7 - 4.0 K/uL   Monocytes Relative 4  3 - 12 %   Monocytes Absolute 0.5  0.1 - 1.0 K/uL   Eosinophils Relative 1  0 - 5 %   Eosinophils Absolute 0.1  0.0 - 0.7 K/uL   Basophils Relative 0  0 - 1 %   Basophils Absolute 0.0  0.0 - 0.1 K/uL  BASIC METABOLIC PANEL     Status: Abnormal   Collection Time    02/23/13  4:37 PM      Result Value Ref Range   Sodium 140  137 - 147 mEq/L    Potassium 4.2  3.7 - 5.3 mEq/L   Chloride 102  96 - 112 mEq/L   CO2 25  19 - 32 mEq/L   Glucose, Bld 107 (*) 70 - 99 mg/dL   BUN 9  6 - 23 mg/dL   Creatinine, Ser 1.01  0.50 - 1.10 mg/dL   Calcium 9.5  8.4 - 10.5 mg/dL   GFR calc non Af Amer 53 (*) >90 mL/min   GFR calc Af Amer 62 (*) >90 mL/min   Comment: (NOTE)     The eGFR has been calculated using the CKD EPI equation.     This calculation has not been validated in all clinical situations.     eGFR's persistently <90 mL/min signify possible Chronic Kidney     Disease.  ETHANOL     Status: None   Collection Time    02/23/13  4:37 PM      Result Value Ref Range   Alcohol, Ethyl (B) <11  0 - 11 mg/dL   Comment:            LOWEST DETECTABLE LIMIT FOR     SERUM ALCOHOL IS 11 mg/dL     FOR MEDICAL PURPOSES ONLY  URINALYSIS, ROUTINE W REFLEX MICROSCOPIC     Status: Abnormal   Collection Time    02/23/13  4:47 PM      Result Value Ref Range   Color, Urine YELLOW  YELLOW   APPearance CLEAR  CLEAR   Specific Gravity, Urine 1.010  1.005 - 1.030   pH 5.5  5.0 - 8.0   Glucose, UA NEGATIVE  NEGATIVE mg/dL   Hgb urine dipstick NEGATIVE  NEGATIVE   Bilirubin Urine NEGATIVE  NEGATIVE   Ketones, ur NEGATIVE  NEGATIVE mg/dL   Protein, ur NEGATIVE  NEGATIVE mg/dL   Urobilinogen, UA 0.2  0.0 - 1.0 mg/dL   Nitrite NEGATIVE  NEGATIVE   Leukocytes, UA SMALL (*) NEGATIVE  URINE RAPID DRUG SCREEN (HOSP PERFORMED)     Status: Abnormal   Collection Time    02/23/13  4:47 PM      Result Value Ref Range   Opiates NONE DETECTED  NONE DETECTED   Cocaine NONE DETECTED  NONE DETECTED   Benzodiazepines POSITIVE (*) NONE DETECTED   Amphetamines NONE DETECTED  NONE DETECTED   Tetrahydrocannabinol POSITIVE (*) NONE DETECTED   Barbiturates NONE DETECTED  NONE DETECTED   Comment:            DRUG SCREEN FOR MEDICAL PURPOSES  ONLY.  IF CONFIRMATION IS NEEDED     FOR ANY PURPOSE, NOTIFY LAB     WITHIN 5 DAYS.                LOWEST DETECTABLE  LIMITS     FOR URINE DRUG SCREEN     Drug Class       Cutoff (ng/mL)     Amphetamine      1000     Barbiturate      200     Benzodiazepine   893     Tricyclics       810     Opiates          300     Cocaine          300     THC              50  URINE MICROSCOPIC-ADD ON     Status: None   Collection Time    02/23/13  4:47 PM      Result Value Ref Range   Squamous Epithelial / LPF RARE  RARE   WBC, UA 3-6  <3 WBC/hpf   Bacteria, UA RARE  RARE   Labs are reviewed and are pertinent for no critical lab values noted, no Thyroid functions to reveiew  No current facility-administered medications for this encounter.   Current Outpatient Prescriptions  Medication Sig Dispense Refill  . albuterol (PROVENTIL HFA;VENTOLIN HFA) 108 (90 BASE) MCG/ACT inhaler Inhale 2 puffs into the lungs every 6 (six) hours as needed for wheezing or shortness of breath.      . ALPRAZolam (XANAX) 1 MG tablet Take 1 mg by mouth 2 (two) times daily.      . celecoxib (CELEBREX) 200 MG capsule Take 200 mg by mouth daily as needed for mild pain.      . Multiple Vitamin (MULTIVITAMIN WITH MINERALS) TABS tablet Take 1 tablet by mouth daily.      . naproxen sodium (ALEVE) 220 MG tablet Take 440 mg by mouth 2 (two) times daily as needed (Pain).      Marland Kitchen tetrahydrozoline 0.05 % ophthalmic solution Place 1 drop into both eyes daily as needed (Dry Eyes).        Psychiatric Specialty Exam:     Blood pressure 195/58, pulse 65, temperature 98.7 F (37.1 C), temperature source Oral, resp. rate 18, height 5' 3.5" (1.613 m), weight 178 lb (80.74 kg), SpO2 95.00%.Body mass index is 31.03 kg/(m^2).  General Appearance: Casual  Eye Contact::  Fair  Speech:  Normal Rate  Volume:  Normal  Mood:  Depressed  Affect:  Congruent  Thought Process:  Circumstantial  Orientation:  Full (Time, Place, and Person)  Thought Content:  NA  Suicidal Thoughts:  No  Homicidal Thoughts:  No  Memory:  Immediate;   Fair  Judgement:  Fair   Insight:  Fair  Psychomotor Activity:  Negative  Concentration:  Fair  Recall:  Fair  Akathisia:  Negative  Handed:  Right  AIMS (if indicated):     Assets:  Desire for Improvement  Sleep:      Treatment Plan Summary: Patient not meeting IP critieria for crises mgmt, safety and or stabilization of MDD and GAD Will proceed with referrals for OP psychotherapy/psychiatric services Will Rx Lexapro 10 mg daily along with Xanax 0.5 mg BID prn  Patient reassured to f/u as needed for IP psychiatric mgmt if unable to contract for safety, having SI with plan,  HI, paranoia, psychosis or delusional behaviors.  Disposition:    Kathryn Long,Kathryn Long 02/23/2013 7:59 PM  I agreed with the findings, treatment and disposition plan of this patient. Berniece Andreas, MD

## 2013-11-09 ENCOUNTER — Other Ambulatory Visit (HOSPITAL_COMMUNITY): Payer: Self-pay | Admitting: Internal Medicine

## 2013-11-09 DIAGNOSIS — Z1231 Encounter for screening mammogram for malignant neoplasm of breast: Secondary | ICD-10-CM

## 2013-11-30 ENCOUNTER — Ambulatory Visit (HOSPITAL_COMMUNITY)
Admission: RE | Admit: 2013-11-30 | Discharge: 2013-11-30 | Disposition: A | Discharge status: Home / Self Care | Payer: Medicare HMO | Source: Ambulatory Visit | Attending: Internal Medicine | Admitting: Internal Medicine

## 2013-11-30 DIAGNOSIS — Z1231 Encounter for screening mammogram for malignant neoplasm of breast: Secondary | ICD-10-CM | POA: Insufficient documentation

## 2014-03-23 ENCOUNTER — Other Ambulatory Visit (HOSPITAL_COMMUNITY): Payer: Self-pay | Admitting: Internal Medicine

## 2014-03-23 ENCOUNTER — Ambulatory Visit (HOSPITAL_COMMUNITY)
Admission: RE | Admit: 2014-03-23 | Discharge: 2014-03-23 | Disposition: A | Discharge status: Home / Self Care | Payer: Commercial Managed Care - HMO | Source: Ambulatory Visit | Attending: Internal Medicine | Admitting: Internal Medicine

## 2014-03-23 DIAGNOSIS — M25512 Pain in left shoulder: Secondary | ICD-10-CM

## 2014-03-23 DIAGNOSIS — S4992XA Unspecified injury of left shoulder and upper arm, initial encounter: Secondary | ICD-10-CM | POA: Diagnosis not present

## 2014-03-23 DIAGNOSIS — M19012 Primary osteoarthritis, left shoulder: Secondary | ICD-10-CM | POA: Diagnosis not present

## 2014-03-23 DIAGNOSIS — S8992XA Unspecified injury of left lower leg, initial encounter: Secondary | ICD-10-CM | POA: Diagnosis not present

## 2014-03-23 DIAGNOSIS — M25562 Pain in left knee: Secondary | ICD-10-CM

## 2014-03-23 DIAGNOSIS — W19XXXA Unspecified fall, initial encounter: Secondary | ICD-10-CM | POA: Insufficient documentation

## 2014-06-23 DIAGNOSIS — F329 Major depressive disorder, single episode, unspecified: Secondary | ICD-10-CM | POA: Diagnosis not present

## 2014-06-23 DIAGNOSIS — I10 Essential (primary) hypertension: Secondary | ICD-10-CM | POA: Diagnosis not present

## 2014-06-23 DIAGNOSIS — I973 Postprocedural hypertension: Secondary | ICD-10-CM | POA: Diagnosis not present

## 2014-06-23 DIAGNOSIS — J449 Chronic obstructive pulmonary disease, unspecified: Secondary | ICD-10-CM | POA: Diagnosis not present

## 2014-06-23 DIAGNOSIS — F419 Anxiety disorder, unspecified: Secondary | ICD-10-CM | POA: Diagnosis not present

## 2014-06-23 DIAGNOSIS — F341 Dysthymic disorder: Secondary | ICD-10-CM | POA: Diagnosis not present

## 2014-06-23 DIAGNOSIS — M1990 Unspecified osteoarthritis, unspecified site: Secondary | ICD-10-CM | POA: Diagnosis not present

## 2014-11-17 DIAGNOSIS — F419 Anxiety disorder, unspecified: Secondary | ICD-10-CM | POA: Diagnosis not present

## 2014-11-17 DIAGNOSIS — F17200 Nicotine dependence, unspecified, uncomplicated: Secondary | ICD-10-CM | POA: Diagnosis not present

## 2014-11-17 DIAGNOSIS — J449 Chronic obstructive pulmonary disease, unspecified: Secondary | ICD-10-CM | POA: Diagnosis not present

## 2014-11-17 DIAGNOSIS — F339 Major depressive disorder, recurrent, unspecified: Secondary | ICD-10-CM | POA: Diagnosis not present

## 2015-02-26 DIAGNOSIS — F329 Major depressive disorder, single episode, unspecified: Secondary | ICD-10-CM | POA: Diagnosis not present

## 2015-02-26 DIAGNOSIS — R739 Hyperglycemia, unspecified: Secondary | ICD-10-CM | POA: Diagnosis not present

## 2015-02-26 DIAGNOSIS — F17200 Nicotine dependence, unspecified, uncomplicated: Secondary | ICD-10-CM | POA: Diagnosis not present

## 2015-02-26 DIAGNOSIS — I973 Postprocedural hypertension: Secondary | ICD-10-CM | POA: Diagnosis not present

## 2015-02-26 DIAGNOSIS — E785 Hyperlipidemia, unspecified: Secondary | ICD-10-CM | POA: Diagnosis not present

## 2015-02-26 DIAGNOSIS — J449 Chronic obstructive pulmonary disease, unspecified: Secondary | ICD-10-CM | POA: Diagnosis not present

## 2015-02-26 DIAGNOSIS — F419 Anxiety disorder, unspecified: Secondary | ICD-10-CM | POA: Diagnosis not present

## 2015-02-26 DIAGNOSIS — F339 Major depressive disorder, recurrent, unspecified: Secondary | ICD-10-CM | POA: Diagnosis not present

## 2015-02-26 DIAGNOSIS — F411 Generalized anxiety disorder: Secondary | ICD-10-CM | POA: Diagnosis not present

## 2015-02-26 DIAGNOSIS — R358 Other polyuria: Secondary | ICD-10-CM | POA: Diagnosis not present

## 2015-02-26 DIAGNOSIS — I10 Essential (primary) hypertension: Secondary | ICD-10-CM | POA: Diagnosis not present

## 2015-05-07 DIAGNOSIS — L218 Other seborrheic dermatitis: Secondary | ICD-10-CM | POA: Diagnosis not present

## 2015-08-06 DIAGNOSIS — E1165 Type 2 diabetes mellitus with hyperglycemia: Secondary | ICD-10-CM | POA: Diagnosis not present

## 2015-08-06 DIAGNOSIS — F17200 Nicotine dependence, unspecified, uncomplicated: Secondary | ICD-10-CM | POA: Diagnosis not present

## 2015-08-06 DIAGNOSIS — I10 Essential (primary) hypertension: Secondary | ICD-10-CM | POA: Diagnosis not present

## 2015-08-06 DIAGNOSIS — J449 Chronic obstructive pulmonary disease, unspecified: Secondary | ICD-10-CM | POA: Diagnosis not present

## 2015-08-06 DIAGNOSIS — F339 Major depressive disorder, recurrent, unspecified: Secondary | ICD-10-CM | POA: Diagnosis not present

## 2015-08-06 DIAGNOSIS — F411 Generalized anxiety disorder: Secondary | ICD-10-CM | POA: Diagnosis not present

## 2015-11-26 DIAGNOSIS — Z23 Encounter for immunization: Secondary | ICD-10-CM | POA: Diagnosis not present

## 2015-11-26 DIAGNOSIS — J449 Chronic obstructive pulmonary disease, unspecified: Secondary | ICD-10-CM | POA: Diagnosis not present

## 2015-11-26 DIAGNOSIS — F339 Major depressive disorder, recurrent, unspecified: Secondary | ICD-10-CM | POA: Diagnosis not present

## 2015-11-26 DIAGNOSIS — F17200 Nicotine dependence, unspecified, uncomplicated: Secondary | ICD-10-CM | POA: Diagnosis not present

## 2015-11-26 DIAGNOSIS — R7303 Prediabetes: Secondary | ICD-10-CM | POA: Diagnosis not present

## 2016-01-04 ENCOUNTER — Telehealth: Payer: Self-pay | Admitting: Acute Care

## 2016-01-08 NOTE — Telephone Encounter (Signed)
Will route to the lung nodule screening

## 2016-01-28 ENCOUNTER — Telehealth: Payer: Self-pay

## 2016-01-28 NOTE — Telephone Encounter (Signed)
Pt received letter from DS to be triage. Please call [phone]

## 2016-01-31 ENCOUNTER — Telehealth: Payer: Self-pay

## 2016-01-31 NOTE — Telephone Encounter (Signed)
I called pt to triage. She does not have regular BM's. OV with Neil Crouch, PA on 02/22/2016 at 11:00 am.

## 2016-01-31 NOTE — Telephone Encounter (Signed)
Pt called again today to speak with DS. She received a letter to call. Please call her at [phone]

## 2016-01-31 NOTE — Telephone Encounter (Signed)
Pt has appt on 02/22/2016.

## 2016-02-20 NOTE — Telephone Encounter (Signed)
Spoke with pt's daughter.  Told her we still have not received a referral for lung screening from pt's doctor. I informed her that we need to do visit prior to pt turning 76 in 08/2016. She states she will contct pt's primary dr again to obtain the referral.  Nothing further needed at this time.

## 2016-02-20 NOTE — Telephone Encounter (Signed)
LMTC x `1 for pt 

## 2016-02-22 ENCOUNTER — Encounter: Payer: Self-pay | Admitting: Gastroenterology

## 2016-02-22 ENCOUNTER — Other Ambulatory Visit: Payer: Self-pay

## 2016-02-22 ENCOUNTER — Ambulatory Visit (INDEPENDENT_AMBULATORY_CARE_PROVIDER_SITE_OTHER): Payer: Medicare HMO | Admitting: Gastroenterology

## 2016-02-22 DIAGNOSIS — Z800 Family history of malignant neoplasm of digestive organs: Secondary | ICD-10-CM | POA: Diagnosis not present

## 2016-02-22 DIAGNOSIS — R194 Change in bowel habit: Secondary | ICD-10-CM

## 2016-02-22 MED ORDER — PEG 3350-KCL-NA BICARB-NACL 420 G PO SOLR: 4000.0000 mL | ORAL | 0 refills | Status: DC

## 2016-02-22 NOTE — Progress Notes (Signed)
Primary Care Physician:  Rosita Fire, MD  Primary Gastroenterologist:  Garfield Cornea, MD   Chief Complaint  Patient presents with  . Constipation  . Colonoscopy    referred for TCS, never had one    HPI:  ABIMBOLA CABLES is a 76 y.o. female here at the request of Dr. Legrand Rams for first ever colonoscopy. In 2013 she declined for colonoscopy, stating that she didn't really know what was involved at the time. Over the past one year she's had a change in bowel habits, worse the last few months. Recently had regular stool, now constipated. Managing with that she will laxative every couple of days. Denies blood in the stool recently. She seen at more than a year ago. No melena. When she eats she feels diffuse bloating and discomfort. No significant heartburn. No dysphagia. No noted weight loss.  She presents with her daughter today. They report family history of colon cancer, patient's sister but they're unclear whether she was in her late 36s early 53s. Other malignancies throughout the family. Patient states her mother lived to be 57 and that's why she wants to consider colonoscopy.   Current Outpatient Prescriptions  Medication Sig Dispense Refill  . ALPRAZolam (XANAX) 1 MG tablet Take 1 mg by mouth 2 (two) times daily.  3  . citalopram (CELEXA) 20 MG tablet Take 20 mg by mouth daily.  3  . cyclobenzaprine (FLEXERIL) 10 MG tablet Take 10 mg by mouth as needed for muscle spasms.    Marland Kitchen lisinopril (PRINIVIL,ZESTRIL) 20 MG tablet Take 20 mg by mouth as needed. Takes when feels light headed or dizzy    . Multiple Vitamin (MULTIVITAMIN WITH MINERALS) TABS tablet Take 1 tablet by mouth daily.    . naproxen sodium (ALEVE) 220 MG tablet Take 440 mg by mouth 2 (two) times daily as needed (Pain).    Marland Kitchen tetrahydrozoline 0.05 % ophthalmic solution Place 1 drop into both eyes daily as needed (Dry Eyes).     No current facility-administered medications for this visit.     Allergies as of 02/22/2016  .  (No Known Allergies)    Past Medical History:  Diagnosis Date  . Anxiety   . Arthritis   . Depression   . Hypertension     Past Surgical History:  Procedure Laterality Date  . TOTAL KNEE ARTHROPLASTY Right 2010    Family History  Problem Relation Age of Onset  . Breast cancer Sister   . Cancer Brother     unknown primary  . Colon cancer Sister     ?age  . Breast cancer Sister     Social History   Social History  . Marital status: Single    Spouse name: N/A  . Number of children: N/A  . Years of education: N/A   Occupational History  . retired    Social History Main Topics  . Smoking status: Current Every Day Smoker  . Smokeless tobacco: Never Used  . Alcohol use No  . Drug use: No  . Sexual activity: Not on file   Other Topics Concern  . Not on file   Social History Narrative  . No narrative on file      ROS:  General: Negative for anorexia, weight loss, fever, chills, fatigue, weakness. Eyes: Negative for vision changes.  ENT: Negative for hoarseness, difficulty swallowing , nasal congestion. CV: Negative for chest pain, angina, palpitations, dyspnea on exertion, peripheral edema.  Respiratory: Negative for dyspnea at rest, dyspnea on exertion, cough, sputum,  wheezing.  GI: See history of present illness. GU:  Negative for dysuria, hematuria, urinary incontinence, urinary frequency, nocturnal urination.  [id number] joint pain Derm: Negative for rash or itching.  Neuro: Negative for weakness, abnormal sensation, seizure, frequent headaches, memory loss, confusion.  Psych: Negative for anxiety, depression, suicidal ideation, hallucinations.  Endo: Negative for unusual weight change.  Heme: Negative for bruising or bleeding. Allergy: Negative for rash or hives.    Physical Examination:  BP 132/84   Pulse 100   Temp 97.7 F (36.5 C) (Oral)   Ht 5' 3.5" (1.613 m)   Wt 172 lb 3.2 oz (78.1 kg)   BMI 30.03 kg/m    General: Well-nourished,  well-developed in no acute distress.  Head: Normocephalic, atraumatic.   Eyes: Conjunctiva pink, no icterus. Mouth: Oropharyngeal mucosa moist and pink , no lesions erythema or exudate. Neck: Supple without thyromegaly, masses, or lymphadenopathy.  Lungs: Clear to auscultation bilaterally.  Heart: Regular rate and rhythm, no murmurs rubs or gallops.  Abdomen: Bowel sounds are normal, nontender, nondistended, no hepatosplenomegaly or masses, no abdominal bruits or    hernia , no rebound or guarding.   Rectal: Not performed Extremities: No lower extremity edema. No clubbing or deformities.  Neuro: Alert and oriented x 4 , grossly normal neurologically.  Skin: Warm and dry, no rash or jaundice.   Psych: Alert and cooperative, normal mood and affect.

## 2016-02-22 NOTE — Patient Instructions (Signed)
Colonoscopy as scheduled. See separate instructions.   Make sure you are using your vegetable laxative the week before your test to adequately manage your constipation so your colon will get cleaned out well during your bowel prep. You don't want to be "backed up" when you start your bowel prep.

## 2016-02-22 NOTE — Assessment & Plan Note (Addendum)
76 year old female with one-year history of change in bowel habits, now with new onset constipation. Managed with over-the-counter vegetable laxative. Postprandial abdominal bloating. Family history of colon cancer, sister, age unclear. Patient desires colonoscopy given change in bowel habit, family history, states her mom lived to be 28 and she wants to prevent colon cancer. Offered what will likely be her only colonoscopy given her age. Augment conscious sedation with Phenergan 12.5 mg IV 45 minutes with the procedure. She was instructed to make sure her bowels are moving regularly with her vegetable laxative preceding colonoscopy prep.  I have discussed the risks, alternatives, benefits with regards to but not limited to the risk of reaction to medication, bleeding, infection, perforation and the patient is agreeable to proceed. Written consent to be obtained.  SPOKE WITH DR. Gala Romney. CODING COLONOSCOPY AS HIGH RISK SCREENING TCS FOR FH COLON CANCER. Patient has vague bowel changes not indication for procedure.

## 2016-02-25 NOTE — Patient Instructions (Signed)
Called [employer] for PA for Colonoscopy. No PA needed, Ref# [id number].

## 2016-02-25 NOTE — Progress Notes (Signed)
CC'D TO PCP °

## 2016-02-27 ENCOUNTER — Telehealth: Payer: Self-pay | Admitting: Internal Medicine

## 2016-02-27 NOTE — Telephone Encounter (Signed)
Tried to call with no answer  

## 2016-02-27 NOTE — Telephone Encounter (Signed)
Pt's daughter, Donita Brooks, called about her mother's prep. Pt is scheduled with [facility] on Monday and the prep is too expensive. Is there something else cheaper or a sample prep we could give her? Daughter thinks the patient may cancel the procedure but not sure. I told her I would have someone call her when they get back from lunch. [id number]

## 2016-02-27 NOTE — Telephone Encounter (Signed)
Donita Brooks (daughter) is wanting to see if her TCS should be coded as a SCREENING and not a change in bowel habits. If not then she would like to try and do the colo-guard at home. Please advise

## 2016-02-29 NOTE — Telephone Encounter (Signed)
I spoke to Dr. Gala Romney. Agrees with coding for screening colonoscopy as patient has had very vague bowel habit change, nothing alarming. She is high risk screen for FH of Colon cancer.   I called daughter and left detailed message. Tried to call patient, no answer and no voicemail.

## 2016-02-29 NOTE — Progress Notes (Signed)
I spoke to Dr. Gala Romney. Agrees with coding for screening colonoscopy as patient has had very vague bowel habit change, nothing alarming. She is high risk screen for FH of Colon cancer.

## 2016-03-03 ENCOUNTER — Ambulatory Visit (HOSPITAL_COMMUNITY)
Admission: RE | Admit: 2016-03-03 | Discharge: 2016-03-03 | Disposition: A | Discharge status: Home / Self Care | Payer: Medicare HMO | Source: Ambulatory Visit | Attending: Internal Medicine | Admitting: Internal Medicine

## 2016-03-03 ENCOUNTER — Encounter (HOSPITAL_COMMUNITY): Payer: Self-pay

## 2016-03-03 ENCOUNTER — Encounter (HOSPITAL_COMMUNITY)
Admission: RE | Disposition: A | Discharge status: Home / Self Care | Payer: Self-pay | Source: Ambulatory Visit | Attending: Internal Medicine

## 2016-03-03 DIAGNOSIS — R194 Change in bowel habit: Secondary | ICD-10-CM

## 2016-03-03 DIAGNOSIS — K6389 Other specified diseases of intestine: Secondary | ICD-10-CM | POA: Insufficient documentation

## 2016-03-03 DIAGNOSIS — K621 Rectal polyp: Secondary | ICD-10-CM | POA: Insufficient documentation

## 2016-03-03 DIAGNOSIS — Z800 Family history of malignant neoplasm of digestive organs: Secondary | ICD-10-CM | POA: Diagnosis not present

## 2016-03-03 DIAGNOSIS — M1990 Unspecified osteoarthritis, unspecified site: Secondary | ICD-10-CM | POA: Diagnosis not present

## 2016-03-03 DIAGNOSIS — Z1211 Encounter for screening for malignant neoplasm of colon: Secondary | ICD-10-CM | POA: Diagnosis not present

## 2016-03-03 DIAGNOSIS — D128 Benign neoplasm of rectum: Secondary | ICD-10-CM

## 2016-03-03 DIAGNOSIS — K5730 Diverticulosis of large intestine without perforation or abscess without bleeding: Secondary | ICD-10-CM | POA: Insufficient documentation

## 2016-03-03 DIAGNOSIS — Z96651 Presence of right artificial knee joint: Secondary | ICD-10-CM | POA: Insufficient documentation

## 2016-03-03 DIAGNOSIS — F419 Anxiety disorder, unspecified: Secondary | ICD-10-CM | POA: Insufficient documentation

## 2016-03-03 DIAGNOSIS — Z1212 Encounter for screening for malignant neoplasm of rectum: Secondary | ICD-10-CM | POA: Diagnosis not present

## 2016-03-03 DIAGNOSIS — D123 Benign neoplasm of transverse colon: Secondary | ICD-10-CM | POA: Diagnosis not present

## 2016-03-03 DIAGNOSIS — F329 Major depressive disorder, single episode, unspecified: Secondary | ICD-10-CM | POA: Diagnosis not present

## 2016-03-03 DIAGNOSIS — K5900 Constipation, unspecified: Secondary | ICD-10-CM | POA: Insufficient documentation

## 2016-03-03 DIAGNOSIS — D120 Benign neoplasm of cecum: Secondary | ICD-10-CM

## 2016-03-03 DIAGNOSIS — D124 Benign neoplasm of descending colon: Secondary | ICD-10-CM | POA: Insufficient documentation

## 2016-03-03 DIAGNOSIS — I10 Essential (primary) hypertension: Secondary | ICD-10-CM | POA: Insufficient documentation

## 2016-03-03 DIAGNOSIS — F17210 Nicotine dependence, cigarettes, uncomplicated: Secondary | ICD-10-CM | POA: Diagnosis not present

## 2016-03-03 HISTORY — PX: COLONOSCOPY: SHX5424

## 2016-03-03 LAB — GLUCOSE, CAPILLARY: Glucose-Capillary: 188 mg/dL — ABNORMAL HIGH (ref 65–99)

## 2016-03-03 SURGERY — COLONOSCOPY
Anesthesia: Moderate Sedation

## 2016-03-03 MED ORDER — MIDAZOLAM HCL 5 MG/5ML IJ SOLN
INTRAMUSCULAR | Status: DC | PRN
  Administered 2016-03-03: 1 mg via INTRAVENOUS
  Administered 2016-03-03: 2 mg via INTRAVENOUS

## 2016-03-03 MED ORDER — SIMETHICONE 40 MG/0.6ML PO SUSP
ORAL | Status: DC | PRN
  Administered 2016-03-03: 2.5 mL

## 2016-03-03 MED ORDER — ONDANSETRON HCL 4 MG/2ML IJ SOLN
INTRAMUSCULAR | Status: AC
  Filled 2016-03-03: qty 2

## 2016-03-03 MED ORDER — MIDAZOLAM HCL 5 MG/5ML IJ SOLN
2.0000 mg | Freq: Once | INTRAMUSCULAR | Status: AC
  Administered 2016-03-03: 2 mg via INTRAVENOUS

## 2016-03-03 MED ORDER — MIDAZOLAM HCL 5 MG/5ML IJ SOLN
INTRAMUSCULAR | Status: AC
  Filled 2016-03-03: qty 5

## 2016-03-03 MED ORDER — SODIUM CHLORIDE 0.9 % IV SOLN
INTRAVENOUS | Status: DC
  Administered 2016-03-03: 14:00:00 via INTRAVENOUS

## 2016-03-03 MED ORDER — MEPERIDINE HCL 100 MG/ML IJ SOLN
INTRAMUSCULAR | Status: DC | PRN
  Administered 2016-03-03: 50 mg via INTRAVENOUS
  Administered 2016-03-03: 25 mg via INTRAVENOUS

## 2016-03-03 MED ORDER — SODIUM CHLORIDE 0.9% FLUSH
INTRAVENOUS | Status: AC
  Filled 2016-03-03: qty 10

## 2016-03-03 MED ORDER — PROMETHAZINE HCL 25 MG/ML IJ SOLN
INTRAMUSCULAR | Status: AC
  Filled 2016-03-03: qty 1

## 2016-03-03 MED ORDER — MEPERIDINE HCL 100 MG/ML IJ SOLN
INTRAMUSCULAR | Status: AC
  Filled 2016-03-03: qty 2

## 2016-03-03 MED ORDER — MIDAZOLAM HCL 5 MG/5ML IJ SOLN
INTRAMUSCULAR | Status: DC
Start: 2016-03-03 — End: 2016-03-03
  Filled 2016-03-03: qty 10

## 2016-03-03 MED ORDER — ONDANSETRON HCL 4 MG/2ML IJ SOLN
INTRAMUSCULAR | Status: DC | PRN
  Administered 2016-03-03: 4 mg via INTRAVENOUS

## 2016-03-03 NOTE — OR Nursing (Signed)
Patient came in very anxious. Patient was short of breath and complaining of pressure in chest.  Patient was diaphoretic.  Dr. Gala Romney made aware and came to do an assessment . 2mg  of versed ordered pre procedure.  Patient heart rate went as high as 156 preoperatively.  Dr. Gala Romney planned to proceed with procedure as planned.

## 2016-03-03 NOTE — Telephone Encounter (Signed)
Route to [place]

## 2016-03-03 NOTE — H&P (View-Only) (Signed)
Primary Care Physician:  Rosita Fire, MD  Primary Gastroenterologist:  Garfield Cornea, MD   Chief Complaint  Patient presents with  . Constipation  . Colonoscopy    referred for TCS, never had one    HPI:  Kathryn Long is a 76 y.o. female here at the request of Dr. Legrand Rams for first ever colonoscopy. In 2013 she declined for colonoscopy, stating that she didn't really know what was involved at the time. Over the past one year she's had a change in bowel habits, worse the last few months. Recently had regular stool, now constipated. Managing with that she will laxative every couple of days. Denies blood in the stool recently. She seen at more than a year ago. No melena. When she eats she feels diffuse bloating and discomfort. No significant heartburn. No dysphagia. No noted weight loss.  She presents with her daughter today. They report family history of colon cancer, patient's sister but they're unclear whether she was in her late 5s early 41s. Other malignancies throughout the family. Patient states her mother lived to be 10 and that's why she wants to consider colonoscopy.   Current Outpatient Prescriptions  Medication Sig Dispense Refill  . ALPRAZolam (XANAX) 1 MG tablet Take 1 mg by mouth 2 (two) times daily.  3  . citalopram (CELEXA) 20 MG tablet Take 20 mg by mouth daily.  3  . cyclobenzaprine (FLEXERIL) 10 MG tablet Take 10 mg by mouth as needed for muscle spasms.    Marland Kitchen lisinopril (PRINIVIL,ZESTRIL) 20 MG tablet Take 20 mg by mouth as needed. Takes when feels light headed or dizzy    . Multiple Vitamin (MULTIVITAMIN WITH MINERALS) TABS tablet Take 1 tablet by mouth daily.    . naproxen sodium (ALEVE) 220 MG tablet Take 440 mg by mouth 2 (two) times daily as needed (Pain).    Marland Kitchen tetrahydrozoline 0.05 % ophthalmic solution Place 1 drop into both eyes daily as needed (Dry Eyes).     No current facility-administered medications for this visit.     Allergies as of 02/22/2016  .  (No Known Allergies)    Past Medical History:  Diagnosis Date  . Anxiety   . Arthritis   . Depression   . Hypertension     Past Surgical History:  Procedure Laterality Date  . TOTAL KNEE ARTHROPLASTY Right 2010    Family History  Problem Relation Age of Onset  . Breast cancer Sister   . Cancer Brother     unknown primary  . Colon cancer Sister     ?age  . Breast cancer Sister     Social History   Social History  . Marital status: Single    Spouse name: N/A  . Number of children: N/A  . Years of education: N/A   Occupational History  . retired    Social History Main Topics  . Smoking status: Current Every Day Smoker  . Smokeless tobacco: Never Used  . Alcohol use No  . Drug use: No  . Sexual activity: Not on file   Other Topics Concern  . Not on file   Social History Narrative  . No narrative on file      ROS:  General: Negative for anorexia, weight loss, fever, chills, fatigue, weakness. Eyes: Negative for vision changes.  ENT: Negative for hoarseness, difficulty swallowing , nasal congestion. CV: Negative for chest pain, angina, palpitations, dyspnea on exertion, peripheral edema.  Respiratory: Negative for dyspnea at rest, dyspnea on exertion, cough, sputum,  wheezing.  GI: See history of present illness. GU:  Negative for dysuria, hematuria, urinary incontinence, urinary frequency, nocturnal urination.  [id number] joint pain Derm: Negative for rash or itching.  Neuro: Negative for weakness, abnormal sensation, seizure, frequent headaches, memory loss, confusion.  Psych: Negative for anxiety, depression, suicidal ideation, hallucinations.  Endo: Negative for unusual weight change.  Heme: Negative for bruising or bleeding. Allergy: Negative for rash or hives.    Physical Examination:  BP 132/84   Pulse 100   Temp 97.7 F (36.5 C) (Oral)   Ht 5' 3.5" (1.613 m)   Wt 172 lb 3.2 oz (78.1 kg)   BMI 30.03 kg/m    General: Well-nourished,  well-developed in no acute distress.  Head: Normocephalic, atraumatic.   Eyes: Conjunctiva pink, no icterus. Mouth: Oropharyngeal mucosa moist and pink , no lesions erythema or exudate. Neck: Supple without thyromegaly, masses, or lymphadenopathy.  Lungs: Clear to auscultation bilaterally.  Heart: Regular rate and rhythm, no murmurs rubs or gallops.  Abdomen: Bowel sounds are normal, nontender, nondistended, no hepatosplenomegaly or masses, no abdominal bruits or    hernia , no rebound or guarding.   Rectal: Not performed Extremities: No lower extremity edema. No clubbing or deformities.  Neuro: Alert and oriented x 4 , grossly normal neurologically.  Skin: Warm and dry, no rash or jaundice.   Psych: Alert and cooperative, normal mood and affect.

## 2016-03-03 NOTE — Op Note (Signed)
[facility] Patient Name: Kathryn Long Procedure Date: 03/03/2016 2:23 PM MRN: [id number] Date of Birth: 05-07-1940 Attending MD: Norvel Richards , MD CSN: [id number] Age: 76 Admit Type: Outpatient Procedure:                Colonoscopy with multiple snare polypectomies Indications:              Screening in patient at increased risk: Family                            history of 1st-degree relative with colorectal                            cancer before age 47 years Providers:                Norvel Richards, MD, Lurline Del, RN, Purcell Nails.                            [place], [employer] Referring MD:             Rosita Fire Medicines:                Midazolam 5 mg IV, Meperidine 75 mg IV Complications:            No immediate complications. Estimated Blood Loss:     Estimated blood loss was minimal. Procedure:                Pre-Anesthesia Assessment:                           - Prior to the procedure, a History and Physical                            was performed, and patient medications and                            allergies were reviewed. The patient's tolerance of                            previous anesthesia was also reviewed. The risks                            and benefits of the procedure and the sedation                            options and risks were discussed with the patient.                            All questions were answered, and informed consent                            was obtained. Prior Anticoagulants: The patient has                            taken no previous anticoagulant or antiplatelet  agents. ASA Grade Assessment: III - A patient with                            severe systemic disease. After reviewing the risks                            and benefits, the patient was deemed in                            satisfactory condition to undergo the procedure.                           After obtaining informed  consent, the colonoscope                            was passed under direct vision. Throughout the                            procedure, the patient's blood pressure, pulse, and                            oxygen saturations were monitored continuously. The                            EC-[id number] scope was introduced through                            the anus and advanced to the the cecum, identified                            by appendiceal orifice and ileocecal valve. The                            ileocecal valve, appendiceal orifice, and rectum                            were photographed. The entire colon was well                            visualized. The colonoscopy was performed without                            difficulty. The quality of the bowel preparation                            was adequate. The entire colon was well visualized. Scope In: 2:33:44 PM Scope Out: 3:04:22 PM Scope Withdrawal Time: 0 hours 20 minutes 57 seconds  Total Procedure Duration: 0 hours 30 minutes 38 seconds  Findings:      The perianal and digital rectal examinations were normal.      Scattered small and large-mouthed diverticula were found in the entire       colon.      (11) semi-pedunculated polyps were found in the rectum (2) descending  colon (5), hepatic flexure (3) and cecum (1). The polyps were 6 to 9 mm       in size. These polyps were removed with a hot and cold snare snare       technique. Resection and retrieval were complete. Estimated blood loss:       none.      The exam was otherwise without abnormality on direct and retroflexion       views. Impression:               - Diverticulosis in the entire examined colon.                           - (11) 6 to 9 mm polyps in the rectum, in the                            descending colon, at the hepatic flexure and in the                            cecum, removed with a hot snare. Resected and                             retrieved.                           Melanosis coli.                           - The examination was otherwise normal on direct                            and retroflexion views. Moderate Sedation:      Moderate (conscious) sedation was administered by the endoscopy nurse       and supervised by the endoscopist. The following parameters were       monitored: oxygen saturation, heart rate, blood pressure, respiratory       rate, EKG, adequacy of pulmonary ventilation, and response to care.       Total physician intraservice time was 38 minutes. Recommendation:           - Patient has a contact number available for                            emergencies. The signs and symptoms of potential                            delayed complications were discussed with the                            patient. Return to normal activities tomorrow.                            Written discharge instructions were provided to the                            patient.                           -  Resume previous diet.                           - Continue present medications.                           - Repeat colonoscopy date to be determined after                            pending pathology results are reviewed for                            surveillance based on pathology results.                           - Return to GI office (date not yet determined). Procedure Code(s):        --- Professional ---                           [phone], Colonoscopy, flexible; with removal of                            tumor(s), polyp(s), or other lesion(s) by snare                            technique                           99152, Moderate sedation services provided by the                            same physician or other qualified health care                            professional performing the diagnostic or                            therapeutic service that the sedation supports,                            requiring the  presence of an independent trained                            observer to assist in the monitoring of the                            patient's level of consciousness and physiological                            status; initial 15 minutes of intraservice time,                            patient age 36 years or older  [id number], Moderate sedation services; each additional                            15 minutes intraservice time                           99153, Moderate sedation services; each additional                            15 minutes intraservice time Diagnosis Code(s):        --- Professional ---                           Z80.0, Family history of malignant neoplasm of                            digestive organs                           K62.1, Rectal polyp                           D12.4, Benign neoplasm of descending colon                           D12.3, Benign neoplasm of transverse colon (hepatic                            flexure or splenic flexure)                           D12.0, Benign neoplasm of cecum                           K57.30, Diverticulosis of large intestine without                            perforation or abscess without bleeding CPT copyright 2016 American Medical Association. All rights reserved. The codes documented in this report are preliminary and upon coder review may  be revised to meet current compliance requirements. Cristopher Estimable. Alylah Blakney, MD Norvel Richards, MD 03/03/2016 3:13:31 PM This report has been signed electronically. Number of Addenda: 0

## 2016-03-03 NOTE — Discharge Instructions (Addendum)
Colon Polyps Introduction Polyps are tissue growths inside the body. Polyps can grow in many places, including the large intestine (colon). A polyp may be a round bump or a mushroom-shaped growth. You could have one polyp or several. Most colon polyps are noncancerous (benign). However, some colon polyps can become cancerous over time. What are the causes? The exact cause of colon polyps is not known. What increases the risk? This condition is more likely to develop in people who:  Have a family history of colon cancer or colon polyps.  Are older than 54 or older than 45 if they are African American.  Have inflammatory bowel disease, such as ulcerative colitis or Crohn disease.  Are overweight.  Smoke cigarettes.  Do not get enough exercise.  Drink too much alcohol.  Eat a diet that is:  High in fat and red meat.  Low in fiber.  Had childhood cancer that was treated with abdominal radiation. What are the signs or symptoms? Most polyps do not cause symptoms. If you have symptoms, they may include:  Blood coming from your rectum when having a bowel movement.  Blood in your stool.The stool may look dark red or black.  A change in bowel habits, such as constipation or diarrhea. How is this diagnosed? This condition is diagnosed with a colonoscopy. This is a procedure that uses a lighted, flexible scope to look at the inside of your colon. How is this treated? Treatment for this condition involves removing any polyps that are found. Those polyps will then be tested for cancer. If cancer is found, your health care provider will talk to you about options for colon cancer treatment. Follow these instructions at home: Diet  Eat plenty of fiber, such as fruits, vegetables, and whole grains.  Eat foods that are high in calcium and vitamin D, such as milk, cheese, yogurt, eggs, liver, fish, and broccoli.  Limit foods high in fat, red meats, and processed meats, such as hot dogs,  sausage, bacon, and lunch meats.  Maintain a healthy weight, or lose weight if recommended by your health care provider. General instructions  Do not smoke cigarettes.  Do not drink alcohol excessively.  Keep all follow-up visits as told by your health care provider. This is important. This includes keeping regularly scheduled colonoscopies. Talk to your health care provider about when you need a colonoscopy.  Exercise every day or as told by your health care provider. Contact a health care provider if:  You have new or worsening bleeding during a bowel movement.  You have new or increased blood in your stool.  You have a change in bowel habits.  You unexpectedly lose weight. This information is not intended to replace advice given to you by your health care provider. Make sure you discuss any questions you have with your health care provider. Document Released: 09/19/2003 Document Revised: 05/31/2015 Document Reviewed: 11/13/2014  2017 Elsevier  Colonoscopy Discharge Instructions  Read the instructions outlined below and refer to this sheet in the next few weeks. These discharge instructions provide you with general information on caring for yourself after you leave the hospital. Your doctor may also give you specific instructions. While your treatment has been planned according to the most current medical practices available, unavoidable complications occasionally occur. If you have any problems or questions after discharge, call Dr. Gala Romney at [phone]. ACTIVITY  You may resume your regular activity, but move at a slower pace for the next 24 hours.   Take frequent rest  periods for the next 24 hours.   Walking will help get rid of the air and reduce the bloated feeling in your belly (abdomen).   No driving for 24 hours (because of the medicine (anesthesia) used during the test).    Do not sign any important legal documents or operate any machinery for 24 hours (because of the  anesthesia used during the test).  NUTRITION  Drink plenty of fluids.   You may resume your normal diet as instructed by your doctor.   Begin with a light meal and progress to your normal diet. Heavy or fried foods are harder to digest and may make you feel sick to your stomach (nauseated).   Avoid alcoholic beverages for 24 hours or as instructed.  MEDICATIONS  You may resume your normal medications unless your doctor tells you otherwise.  WHAT YOU CAN EXPECT TODAY  Some feelings of bloating in the abdomen.   Passage of more gas than usual.   Spotting of blood in your stool or on the toilet paper.  IF YOU HAD POLYPS REMOVED DURING THE COLONOSCOPY:  No aspirin products for 7 days or as instructed.   No alcohol for 7 days or as instructed.   Eat a soft diet for the next 24 hours.  FINDING OUT THE RESULTS OF YOUR TEST Not all test results are available during your visit. If your test results are not back during the visit, make an appointment with your caregiver to find out the results. Do not assume everything is normal if you have not heard from your caregiver or the medical facility. It is important for you to follow up on all of your test results.  SEEK IMMEDIATE MEDICAL ATTENTION IF:  You have more than a spotting of blood in your stool.   Your belly is swollen (abdominal distention).   You are nauseated or vomiting.   You have a temperature over 101.   You have abdominal pain or discomfort that is severe or gets worse throughout the day.    Colon diverticulosis and polyp information provided  Further recommendations to follow pending review of pathology report

## 2016-03-03 NOTE — Interval H&P Note (Signed)
History and Physical Interval Note:  03/03/2016 2:23 PM  Kathryn Long  has presented today for surgery, with the diagnosis of change bowels, [facility]  The various methods of treatment have been discussed with the patient and family. After consideration of risks, benefits and other options for treatment, the patient has consented to  Procedure(s) with comments: COLONOSCOPY (N/A) - 2:45pm as a surgical intervention .  The patient's history has been reviewed, patient examined, no change in status, stable for surgery.  I have reviewed the patient's chart and labs.  Questions were answered to the patient's satisfaction.    Patient here for colonoscopy. Late afternoon procedure. Has not had her Xanax today. No food today.   Blood sugar 188. Tachycardic and somewhat diaphoretic. That improved with 2 mg of Versed and preop. Daughter accompanies her and states that she has to use of frequently when she gets "stressed out". She is significantly improved after 2 mg of Versed given. Will proceed with a first ever screening colonoscopy today per plan.  The risks, benefits, limitations, alternatives and imponderables have been reviewed with the patient. Questions have been answered. All parties are agreeable.    Manus Rudd

## 2016-03-04 NOTE — Telephone Encounter (Signed)
Routing to [place] for her to review

## 2016-03-06 ENCOUNTER — Encounter: Payer: Self-pay | Admitting: Internal Medicine

## 2016-03-06 ENCOUNTER — Encounter (HOSPITAL_COMMUNITY): Payer: Self-pay | Admitting: Internal Medicine

## 2016-03-13 ENCOUNTER — Emergency Department (HOSPITAL_COMMUNITY)
Admission: EM | Admit: 2016-03-13 | Discharge: 2016-03-13 | Disposition: A | Discharge status: Home / Self Care | Payer: Medicare HMO | Attending: Emergency Medicine | Admitting: Emergency Medicine

## 2016-03-13 ENCOUNTER — Emergency Department (HOSPITAL_COMMUNITY): Payer: Medicare HMO

## 2016-03-13 ENCOUNTER — Encounter (HOSPITAL_COMMUNITY): Payer: Self-pay

## 2016-03-13 DIAGNOSIS — I10 Essential (primary) hypertension: Secondary | ICD-10-CM | POA: Diagnosis not present

## 2016-03-13 DIAGNOSIS — R1010 Upper abdominal pain, unspecified: Secondary | ICD-10-CM | POA: Diagnosis present

## 2016-03-13 DIAGNOSIS — R000 Tachycardia, unspecified: Secondary | ICD-10-CM | POA: Diagnosis not present

## 2016-03-13 DIAGNOSIS — N390 Urinary tract infection, site not specified: Secondary | ICD-10-CM | POA: Diagnosis not present

## 2016-03-13 DIAGNOSIS — Z79899 Other long term (current) drug therapy: Secondary | ICD-10-CM | POA: Diagnosis not present

## 2016-03-13 DIAGNOSIS — K439 Ventral hernia without obstruction or gangrene: Secondary | ICD-10-CM | POA: Diagnosis not present

## 2016-03-13 DIAGNOSIS — F17200 Nicotine dependence, unspecified, uncomplicated: Secondary | ICD-10-CM | POA: Diagnosis not present

## 2016-03-13 DIAGNOSIS — R1110 Vomiting, unspecified: Secondary | ICD-10-CM | POA: Diagnosis not present

## 2016-03-13 LAB — COMPREHENSIVE METABOLIC PANEL
ALT: 11 U/L — ABNORMAL LOW (ref 14–54)
ANION GAP: 11 (ref 5–15)
AST: 19 U/L (ref 15–41)
Albumin: 4 g/dL (ref 3.5–5.0)
Alkaline Phosphatase: 128 U/L — ABNORMAL HIGH (ref 38–126)
BILIRUBIN TOTAL: 0.6 mg/dL (ref 0.3–1.2)
BUN: 5 mg/dL — ABNORMAL LOW (ref 6–20)
CO2: 21 mmol/L — ABNORMAL LOW (ref 22–32)
Calcium: 9.5 mg/dL (ref 8.9–10.3)
Chloride: 102 mmol/L (ref 101–111)
Creatinine, Ser: 0.94 mg/dL (ref 0.44–1.00)
GFR, EST NON AFRICAN AMERICAN: 57 mL/min — AB (ref >60)
Glucose, Bld: 168 mg/dL — ABNORMAL HIGH (ref 65–99)
POTASSIUM: 3.5 mmol/L (ref 3.5–5.1)
Sodium: 134 mmol/L — ABNORMAL LOW (ref 135–145)
TOTAL PROTEIN: 7.4 g/dL (ref 6.5–8.1)

## 2016-03-13 LAB — URINALYSIS, ROUTINE W REFLEX MICROSCOPIC
Bilirubin Urine: NEGATIVE
GLUCOSE, UA: NEGATIVE mg/dL
NITRITE: NEGATIVE
PROTEIN: NEGATIVE mg/dL
Specific Gravity, Urine: 1.01 (ref 1.005–1.030)
pH: 6.5 (ref 5.0–8.0)

## 2016-03-13 LAB — CBC WITH DIFFERENTIAL/PLATELET
Basophils Absolute: 0 10*3/uL (ref 0.0–0.1)
Basophils Relative: 0 %
EOS PCT: 1 %
Eosinophils Absolute: 0.1 10*3/uL (ref 0.0–0.7)
HEMATOCRIT: 40.8 % (ref 36.0–46.0)
Hemoglobin: 14.7 g/dL (ref 12.0–15.0)
LYMPHS PCT: 33 %
Lymphs Abs: 4.3 10*3/uL — ABNORMAL HIGH (ref 0.7–4.0)
MCH: 28.4 pg (ref 26.0–34.0)
MCHC: 36 g/dL (ref 30.0–36.0)
MCV: 78.8 fL (ref 78.0–100.0)
MONO ABS: 0.7 10*3/uL (ref 0.1–1.0)
MONOS PCT: 5 %
NEUTROS ABS: 7.9 10*3/uL — AB (ref 1.7–7.7)
Neutrophils Relative %: 61 %
PLATELETS: 518 10*3/uL — AB (ref 150–400)
RBC: 5.18 MIL/uL — ABNORMAL HIGH (ref 3.87–5.11)
RDW: 15.1 % (ref 11.5–15.5)
WBC: 12.9 10*3/uL — ABNORMAL HIGH (ref 4.0–10.5)

## 2016-03-13 LAB — URINALYSIS, MICROSCOPIC (REFLEX)

## 2016-03-13 LAB — TROPONIN I

## 2016-03-13 LAB — LIPASE, BLOOD: LIPASE: 19 U/L (ref 11–51)

## 2016-03-13 MED ORDER — GI COCKTAIL ~~LOC~~
30.0000 mL | Freq: Once | ORAL | Status: AC
  Administered 2016-03-13: 30 mL via ORAL
  Filled 2016-03-13: qty 30

## 2016-03-13 MED ORDER — CEPHALEXIN 500 MG PO CAPS: 500.0000 mg | ORAL_CAPSULE | Freq: Three times a day (TID) | ORAL | 0 refills | Status: DC

## 2016-03-13 MED ORDER — ONDANSETRON HCL 4 MG/2ML IJ SOLN
4.0000 mg | Freq: Once | INTRAMUSCULAR | Status: AC
  Administered 2016-03-13: 4 mg via INTRAVENOUS
  Filled 2016-03-13: qty 2

## 2016-03-13 MED ORDER — IOPAMIDOL (ISOVUE-300) INJECTION 61%
INTRAVENOUS | Status: AC
  Administered 2016-03-13: 30 mL
  Filled 2016-03-13: qty 30

## 2016-03-13 MED ORDER — IOPAMIDOL (ISOVUE-300) INJECTION 61%
100.0000 mL | Freq: Once | INTRAVENOUS | Status: AC | PRN
  Administered 2016-03-13: 100 mL via INTRAVENOUS

## 2016-03-13 MED ORDER — DEXTROSE 5 % IV SOLN
1.0000 g | Freq: Once | INTRAVENOUS | Status: AC
  Administered 2016-03-13: 1 g via INTRAVENOUS
  Filled 2016-03-13: qty 10

## 2016-03-13 MED ORDER — ONDANSETRON HCL 4 MG PO TABS: 4.0000 mg | ORAL_TABLET | Freq: Three times a day (TID) | ORAL | 0 refills | Status: DC | PRN

## 2016-03-13 MED ORDER — PROMETHAZINE HCL 25 MG/ML IJ SOLN
12.5000 mg | Freq: Once | INTRAMUSCULAR | Status: AC
  Administered 2016-03-13: 12.5 mg via INTRAVENOUS
  Filled 2016-03-13: qty 1

## 2016-03-13 MED ORDER — SODIUM CHLORIDE 0.9 % IV SOLN
Freq: Once | INTRAVENOUS | Status: AC
  Administered 2016-03-13: 06:00:00 via INTRAVENOUS

## 2016-03-13 MED ORDER — OMEPRAZOLE 20 MG PO CPDR: 20.0000 mg | DELAYED_RELEASE_CAPSULE | Freq: Every day | ORAL | 0 refills | Status: DC

## 2016-03-13 NOTE — ED Notes (Signed)
Pt given water to drink for fluid challenge 

## 2016-03-13 NOTE — ED Triage Notes (Signed)
Lower abd pain with vomiting for the past 24 hours, states she had a colonoscopy on Monday but denies rectal bleeding or dark stools.  Pt family member states pt had 20 polyps removed.

## 2016-03-13 NOTE — ED Notes (Signed)
Patient transported to CT 

## 2016-03-13 NOTE — ED Notes (Signed)
Pt reports increased nausea after fluid challenge. EDP notified. Order given to administer Phenergan 12.5mg  IV.

## 2016-03-13 NOTE — ED Notes (Signed)
ED Provider at bedside. 

## 2016-03-13 NOTE — ED Notes (Signed)
While pt was rolling back from ct, pt hr increased to 140's range, pt placed back in room, RN at bedside, pt converted back to NSR with rate of 70-80's, Dr Wyvonnia Dusky notified, repeat ekg performed, rhythm strip of 140's printed out,

## 2016-03-13 NOTE — Discharge Instructions (Signed)
Take the antibiotics as prescribed for a urinary tract infection. Return to the ED if you develop worsening pain, vomiting, or other concerns. Followup with the cardiologist regarding your fast heart rate. If you develop chest pain, shortness of breath, or palpations that are prolonged, return to the ED. Follow up regarding the small hernia by your stomach.

## 2016-03-13 NOTE — ED Provider Notes (Signed)
[facility] DEPT Provider Note   CSN: [id number] Arrival date & time: 03/13/16  0522     History   Chief Complaint Chief Complaint  Patient presents with  . Abdominal Pain    HPI [name] is a 76 y.o. female.  Patient presents with 36 hour history of nausea vomiting and abdominal pain. States the pain started 2 nights ago and has been constant in her upper abdomen that spreading diffusely. Has been unable to keep anything down over the past and a half. Emesis has been clear and yellow and green. Denies blood in her emesis. Bowel movement yesterday. Patient had a colonoscopy in February 26 that showed multiple polyps and diverticulosis. She states she did well after the colonoscopy. Did not have an EGD. No previous abdominal surgeries. No chest pain or shortness of breath. Pain is worse with palpation and movement. Denies any urinary symptoms. Denies any fever. No vaginal bleeding or discharge. She has had some chills at home.   The history is provided by the patient and a relative.  Abdominal Pain   Associated symptoms include nausea and vomiting. Pertinent negatives include fever, diarrhea, constipation, dysuria, headaches, arthralgias and myalgias.    Past Medical History:  Diagnosis Date  . Anxiety   . Arthritis   . Depression   . Hypertension     Patient Active Problem List   Diagnosis Date Noted  . Bowel habit changes 02/22/2016  . FH: colon cancer 02/22/2016    Past Surgical History:  Procedure Laterality Date  . COLONOSCOPY N/A 03/03/2016   Procedure: COLONOSCOPY;  Surgeon: Daneil Dolin, MD;  Location: [facility];  Service: Endoscopy;  Laterality: N/A;  2:45pm  . TOTAL KNEE ARTHROPLASTY Right 2010    OB History    No data available       Home Medications    Prior to Admission medications   Medication Sig Start Date End Date Taking? Authorizing Provider  ALPRAZolam Duanne Moron) 1 MG tablet Take 1 mg by mouth 2 (two) times daily. 02/14/16    Historical Provider, MD  citalopram (CELEXA) 20 MG tablet Take 20 mg by mouth daily. 02/14/16   Historical Provider, MD  cyclobenzaprine (FLEXERIL) 10 MG tablet Take 10 mg by mouth 3 (three) times daily as needed for muscle spasms.     Historical Provider, MD  lisinopril (PRINIVIL,ZESTRIL) 20 MG tablet Take 20 mg by mouth daily as needed. Takes when feels light headed or dizzy     Historical Provider, MD  Multiple Vitamin (MULTIVITAMIN WITH MINERALS) TABS tablet Take 1 tablet by mouth daily.    Historical Provider, MD  naproxen sodium (ALEVE) 220 MG tablet Take 440 mg by mouth 2 (two) times daily as needed (Pain).    Historical Provider, MD  tetrahydrozoline 0.05 % ophthalmic solution Place 1 drop into both eyes daily as needed (Dry Eyes).    Historical Provider, MD    Family History Family History  Problem Relation Age of Onset  . Breast cancer Sister   . Cancer Brother     unknown primary  . Colon cancer Sister     ?age  . Breast cancer Sister     Social History Social History  Substance Use Topics  . Smoking status: Current Every Day Smoker  . Smokeless tobacco: Never Used  . Alcohol use No     Allergies   Patient has no known allergies.   Review of Systems Review of Systems  Constitutional: Positive for activity change, appetite change  and chills. Negative for fever.  Respiratory: Negative for cough, chest tightness and shortness of breath.   Cardiovascular: Negative for chest pain.  Gastrointestinal: Positive for abdominal pain, nausea and vomiting. Negative for constipation and diarrhea.  Genitourinary: Negative for dysuria, pelvic pain and vaginal discharge.  Musculoskeletal: Negative for arthralgias and myalgias.  Neurological: Negative for dizziness, weakness and headaches.   A complete 10 system review of systems was obtained and all systems are negative except as noted in the HPI and PMH.    Physical Exam Updated Vital Signs BP 197/85 (BP Location: Left Arm)    Pulse 86   Temp 98.6 F (37 C) (Oral)   Resp 19   Ht 5\' 3"  (1.6 m)   Wt 173 lb (78.5 kg)   SpO2 97%   BMI 30.65 kg/m   Physical Exam  Constitutional: She is oriented to person, place, and time. She appears well-developed and well-nourished. No distress.  HENT:  Head: Normocephalic and atraumatic.  Mouth/Throat: Oropharynx is clear and moist. No oropharyngeal exudate.  Moist mucus membranes  Eyes: Conjunctivae and EOM are normal. Pupils are equal, round, and reactive to light.  Neck: Normal range of motion. Neck supple.  No meningismus.  Cardiovascular: Normal rate, regular rhythm, normal heart sounds and intact distal pulses.   No murmur heard. Pulmonary/Chest: Effort normal and breath sounds normal. No respiratory distress.  Abdominal: Soft. There is tenderness. There is no rebound and no guarding.  Epigastric, RUQ, and suprapubic tenderness  no guarding or rebound  Musculoskeletal: Normal range of motion. She exhibits no edema or tenderness.  No CVAT  Neurological: She is alert and oriented to person, place, and time. No cranial nerve deficit. She exhibits normal muscle tone. Coordination normal.  No ataxia on finger to nose bilaterally. No pronator drift. 5/5 strength throughout. CN 2-12 intact.Equal grip strength. Sensation intact.   Skin: Skin is warm.  Psychiatric: She has a normal mood and affect. Her behavior is normal.  Nursing note and vitals reviewed.    ED Treatments / Results  Labs (all labs ordered are listed, but only abnormal results are displayed) Labs Reviewed  CBC WITH DIFFERENTIAL/PLATELET - Abnormal; Notable for the following:       Result Value   WBC 12.9 (*)    RBC 5.18 (*)    Platelets 518 (*)    Neutro Abs 7.9 (*)    Lymphs Abs 4.3 (*)    All other components within normal limits  COMPREHENSIVE METABOLIC PANEL - Abnormal; Notable for the following:    Sodium 134 (*)    CO2 21 (*)    Glucose, Bld 168 (*)    BUN 5 (*)    ALT 11 (*)     Alkaline Phosphatase 128 (*)    GFR calc non Af Amer 57 (*)    All other components within normal limits  LIPASE, BLOOD  TROPONIN I  URINALYSIS, ROUTINE W REFLEX MICROSCOPIC    EKG  EKG Interpretation  Date/Time:  Thursday March 13 2016 05:53:54 EST Ventricular Rate:  61 PR Interval:    QRS Duration: 88 QT Interval:  432 QTC Calculation: 436 R Axis:   68 Text Interpretation:  Sinus rhythm No significant change was found Confirmed by Wyvonnia Dusky  MD, Cervando Durnin [phone]) on 03/13/2016 5:58:10 AM       Radiology Ct Abdomen Pelvis W Contrast  Result Date: 03/13/2016 CLINICAL DATA:  Epigastric pain. Vomiting. Recent colonoscopy 3 days ago. EXAM: CT ABDOMEN AND PELVIS WITH CONTRAST TECHNIQUE:  Multidetector CT imaging of the abdomen and pelvis was performed using the standard protocol following bolus administration of intravenous contrast. CONTRAST:  90mL ISOVUE-300 IOPAMIDOL (ISOVUE-300) INJECTION 61%, 160mL ISOVUE-300 IOPAMIDOL (ISOVUE-300) INJECTION 61% COMPARISON:  None. FINDINGS: Lower chest: Lung bases clear Hepatobiliary: Negative Pancreas: Negative Spleen: Negative Adrenals/Urinary Tract: Negative Stomach/Bowel: Small hiatal hernia. Stomach and small bowel normal. Negative for bowel obstruction. No bowel mass or edema. Mild sigmoid diverticulosis. Appendix not visualized. Vascular/Lymphatic: Moderate atherosclerotic disease. No aortic aneurysm. SMA originates from the celiac axis. Reproductive: Multiple calcified uterine fibroids. 15 mm calcification left adnexum may be within the ovary. No mass lesion. Other: No free fluid.  No free air. Epigastric hernia anteriorly containing fat and a vessel. Musculoskeletal: Lumbar degenerative changes. No acute skeletal abnormality. IMPRESSION: Negative for free fluid or free air related to recent colonoscopy. Atherosclerotic disease Epigastric hernia containing fat and a vessel. Sigmoid diverticulosis without diverticulitis.  No bowel wall edema. Electronically  Signed   By: Franchot Gallo M.D.   On: 03/13/2016 07:16    Procedures Procedures (including critical care time)  Medications Ordered in ED Medications  iopamidol (ISOVUE-300) 61 % injection (not administered)  ondansetron (ZOFRAN) injection 4 mg (4 mg Intravenous Given 03/13/16 0553)  0.9 %  sodium chloride infusion ( Intravenous New Bag/Given 03/13/16 0553)     Initial Impression / Assessment and Plan / ED Course  I have reviewed the triage vital signs and the nursing notes.  Pertinent labs & imaging results that were available during my care of the patient were reviewed by me and considered in my medical decision making (see chart for details).    Upper abdominal pain with vomiting x 36 hours.  Recent coloscopy with multiple polyps removed.  Labs show mild leukocytosis which appears to be chronic. LFTs and lipase are normal. Normal creatinine. Hyperglycemia without DKA.  Patient had brief episode of tachycardia to 140s on monitor which appeared regular.  This resolved on own after several minutes. Patient was asymptomatic.  Could have been atrial flutter. Repeat EKG Nsr and unchanged.  CT shows hiatal and epigastric hernia containing fat and vessel. This is likely source of her pain.   NO vomiting in the ED.  Start PPI. Zofran, anitbiotics for UTI. Culture sent, rocephin given.  Followup with PCP and surgery regarding epigastric hernia. Also refer to cardiology given episode of tachycardia.  Return to the ED if pain or vomiting worsens. Should also return to the ED if tachycardia lasts more than a few minutes.   Final Clinical Impressions(s) / ED Diagnoses   Final diagnoses:  Urinary tract infection without hematuria, site unspecified  Tachycardia  Epigastric hernia    New Prescriptions New Prescriptions   No medications on file     Ezequiel Essex, MD 03/13/16 [phone]

## 2016-03-13 NOTE — ED Notes (Signed)
Pt & family member informed of need for urine sample.

## 2016-03-13 NOTE — ED Notes (Signed)
Pt c/o intermittent abd pain for "awhile"  N/v for the past few days, had colonoscopy performed 02/028/2018

## 2016-03-15 LAB — URINE CULTURE

## 2016-04-01 DIAGNOSIS — I10 Essential (primary) hypertension: Secondary | ICD-10-CM | POA: Diagnosis not present

## 2016-04-01 DIAGNOSIS — J449 Chronic obstructive pulmonary disease, unspecified: Secondary | ICD-10-CM | POA: Diagnosis not present

## 2016-04-01 DIAGNOSIS — F419 Anxiety disorder, unspecified: Secondary | ICD-10-CM | POA: Diagnosis not present

## 2016-04-01 DIAGNOSIS — K5900 Constipation, unspecified: Secondary | ICD-10-CM | POA: Diagnosis not present

## 2016-08-07 ENCOUNTER — Encounter: Payer: Self-pay | Admitting: General Surgery

## 2016-08-07 ENCOUNTER — Other Ambulatory Visit (HOSPITAL_COMMUNITY): Payer: Self-pay | Admitting: Internal Medicine

## 2016-08-07 ENCOUNTER — Ambulatory Visit (INDEPENDENT_AMBULATORY_CARE_PROVIDER_SITE_OTHER): Payer: Medicare HMO | Admitting: General Surgery

## 2016-08-07 VITALS — BP 158/67 | HR 94 | Temp 97.5°F | Resp 18 | Ht 63.0 in | Wt 176.0 lb

## 2016-08-07 DIAGNOSIS — J449 Chronic obstructive pulmonary disease, unspecified: Secondary | ICD-10-CM | POA: Diagnosis not present

## 2016-08-07 DIAGNOSIS — F17200 Nicotine dependence, unspecified, uncomplicated: Secondary | ICD-10-CM | POA: Diagnosis not present

## 2016-08-07 DIAGNOSIS — F339 Major depressive disorder, recurrent, unspecified: Secondary | ICD-10-CM | POA: Diagnosis not present

## 2016-08-07 DIAGNOSIS — E785 Hyperlipidemia, unspecified: Secondary | ICD-10-CM | POA: Diagnosis not present

## 2016-08-07 DIAGNOSIS — K439 Ventral hernia without obstruction or gangrene: Secondary | ICD-10-CM | POA: Diagnosis not present

## 2016-08-07 DIAGNOSIS — E1165 Type 2 diabetes mellitus with hyperglycemia: Secondary | ICD-10-CM | POA: Diagnosis not present

## 2016-08-07 DIAGNOSIS — Z780 Asymptomatic menopausal state: Secondary | ICD-10-CM

## 2016-08-07 DIAGNOSIS — R739 Hyperglycemia, unspecified: Secondary | ICD-10-CM | POA: Diagnosis not present

## 2016-08-07 DIAGNOSIS — Z1389 Encounter for screening for other disorder: Secondary | ICD-10-CM | POA: Diagnosis not present

## 2016-08-07 DIAGNOSIS — I10 Essential (primary) hypertension: Secondary | ICD-10-CM | POA: Diagnosis not present

## 2016-08-07 DIAGNOSIS — F17210 Nicotine dependence, cigarettes, uncomplicated: Secondary | ICD-10-CM | POA: Diagnosis not present

## 2016-08-07 DIAGNOSIS — R7303 Prediabetes: Secondary | ICD-10-CM | POA: Diagnosis not present

## 2016-08-07 DIAGNOSIS — F329 Major depressive disorder, single episode, unspecified: Secondary | ICD-10-CM | POA: Diagnosis not present

## 2016-08-07 DIAGNOSIS — I973 Postprocedural hypertension: Secondary | ICD-10-CM | POA: Diagnosis not present

## 2016-08-07 NOTE — Progress Notes (Signed)
Kathryn Long; [id number]; January 14, 1940   HPI Patient is a 76 year old black female who referred herself to my care for evaluation and treatment of a ventral hernia. She states has been present for over a year, but seems to be increasing in size. She has had one emergency room visit due to pain in that area. A CT scan done in the past revealed a ventral hernia in the epigastric region that contained fat. She currently has no pain. The swelling does come and go. No nausea or vomiting have been noted. She has not had surgery in this region. Past Medical History:  Diagnosis Date  . Anxiety   . Arthritis   . Depression   . Hypertension     Past Surgical History:  Procedure Laterality Date  . COLONOSCOPY N/A 03/03/2016   Procedure: COLONOSCOPY;  Surgeon: Daneil Dolin, MD;  Location: [facility];  Service: Endoscopy;  Laterality: N/A;  2:45pm  . TOTAL KNEE ARTHROPLASTY Right 2010    Family History  Problem Relation Age of Onset  . Breast cancer Sister   . Cancer Brother        unknown primary  . Colon cancer Sister        ?age  . Breast cancer Sister     Current Outpatient Prescriptions on File Prior to Visit  Medication Sig Dispense Refill  . ALPRAZolam (XANAX) 1 MG tablet Take 1 mg by mouth 2 (two) times daily.  3  . cephALEXin (KEFLEX) 500 MG capsule Take 1 capsule (500 mg total) by mouth 3 (three) times daily. 30 capsule 0  . citalopram (CELEXA) 20 MG tablet Take 20 mg by mouth daily.  3  . cyclobenzaprine (FLEXERIL) 10 MG tablet Take 10 mg by mouth 3 (three) times daily as needed for muscle spasms.     Marland Kitchen lisinopril (PRINIVIL,ZESTRIL) 20 MG tablet Take 20 mg by mouth daily as needed. Takes when feels light headed or dizzy     . Multiple Vitamin (MULTIVITAMIN WITH MINERALS) TABS tablet Take 1 tablet by mouth daily.    . naproxen sodium (ALEVE) 220 MG tablet Take 440 mg by mouth 2 (two) times daily as needed (Pain).    Marland Kitchen omeprazole (PRILOSEC) 20 MG capsule Take 1 capsule (20 mg  total) by mouth daily. 30 capsule 0  . ondansetron (ZOFRAN) 4 MG tablet Take 1 tablet (4 mg total) by mouth every 8 (eight) hours as needed for nausea or vomiting. 12 tablet 0  . tetrahydrozoline 0.05 % ophthalmic solution Place 1 drop into both eyes daily as needed (Dry Eyes).     No current facility-administered medications on file prior to visit.     No Known Allergies  History  Alcohol Use No    History  Smoking Status  . Current Every Day Smoker  Smokeless Tobacco  . Never Used    Review of Systems  Constitutional: Positive for malaise/fatigue.  HENT: Negative.   Eyes: Negative.   Respiratory: Positive for shortness of breath.   Cardiovascular: Negative.   Gastrointestinal: Positive for abdominal pain and heartburn.  Genitourinary: Positive for frequency.  Musculoskeletal: Positive for joint pain.  Skin: Negative.   Neurological: Negative.   Endo/Heme/Allergies: Negative.   Psychiatric/Behavioral: Negative.     Objective   Vitals:   08/07/16 0921  BP: (!) 158/67  Pulse: 94  Resp: 18  Temp: (!) 97.5 F (36.4 C)    Physical Exam  Constitutional: She is oriented to person, place, and time and well-developed, well-nourished,  and in no distress.  HENT:  Head: Normocephalic and atraumatic.  Cardiovascular: Normal rate, regular rhythm and normal heart sounds.   No murmur heard. Pulmonary/Chest: Effort normal and breath sounds normal. She has no wheezes. She has no rales.  Abdominal: Soft. Bowel sounds are normal. She exhibits no distension. There is no tenderness.  Ventral hernia noted below the xiphoid process which extends from the midline to the right. It is somewhat reducible. It is soft. No bowel sounds present in it.  Neurological: She is alert and oriented to person, place, and time.  Uses a cane  Skin: Skin is warm and dry.  Vitals reviewed.  CT scan images personally reviewed Assessment  Ventral hernia, currently asymptomatic Plan   I did discuss  surgical repair with the patient. She is not interested at this time, which is fine with me. Should she become more symptomatic, she was instructed to return to my office. Follow-up as needed. Literature given.

## 2016-08-07 NOTE — Patient Instructions (Signed)
 Ventral Hernia A ventral hernia is a bulge of tissue from inside the abdomen that pushes through a weak area of the muscles that form the front wall of the abdomen. The tissues inside the abdomen are inside a sac (peritoneum). These tissues include the small intestine, large intestine, and the fatty tissue that covers the intestines (omentum). Sometimes, the bulge that forms a hernia contains intestines. Other hernias contain only fat. Ventral hernias do not go away without surgical treatment. There are several types of ventral hernias. You may have:  A hernia at an incision site from previous abdominal surgery (incisional hernia).  A hernia just above the belly button (epigastric hernia), or at the belly button (umbilical hernia). These types of hernias can develop from heavy lifting or straining.  A hernia that comes and goes (reducible hernia). It may be visible only when you lift or strain. This type of hernia can be pushed back into the abdomen (reduced).  A hernia that traps abdominal tissue inside the hernia (incarcerated hernia). This type of hernia does not reduce.  A hernia that cuts off blood flow to the tissues inside the hernia (strangulated hernia). The tissues can start to die if this happens. This is a very painful bulge that cannot be reduced. A strangulated hernia is a medical emergency.  What are the causes? This condition is caused by abdominal tissue putting pressure on an area of weakness in the abdominal muscles. What increases the risk? The following factors may make you more likely to develop this condition:  Being female.  Being 60 or older.  Being overweight or obese.  Having had previous abdominal surgery, especially if there was an infection after surgery.  Having had an injury to the abdominal wall.  Having had several pregnancies.  Having a buildup of fluid inside the abdomen (ascites).  What are the signs or symptoms? The only symptom of a ventral  hernia may be a painless bulge in the abdomen. A reducible hernia may be visible only when you strain, cough, or lift. Other symptoms may include:  Dull pain.  A feeling of pressure.  Signs and symptoms of a strangulated hernia may include:  Increasing pain.  Nausea and vomiting.  Pain when pressing on the hernia.  The skin over the hernia turning red or purple.  Constipation.  Blood in the stool (feces).  How is this diagnosed? This condition may be diagnosed based on:  Your symptoms.  Your medical history.  A physical exam. You may be asked to cough or strain while standing. These actions increase the pressure inside your abdomen and force the hernia through the opening in your muscles. Your health care provider may try to reduce the hernia by pressing on it.  Imaging studies, such as an ultrasound or CT scan.  How is this treated? This condition is treated with surgery. If you have a strangulated hernia, surgery is done as soon as possible. If your hernia is small and not incarcerated, you may be asked to lose some weight before surgery. Follow these instructions at home:  Follow instructions from your health care provider about eating or drinking restrictions.  If you are overweight, your health care provider may recommend that you increase your activity level and eat a healthier diet.  Do not lift anything that is heavier than 10 lb (4.5 kg).  Return to your normal activities as told by your health care provider. Ask your health care provider what activities are safe for you. You   may need to avoid activities that increase pressure on your hernia.  Take over-the-counter and prescription medicines only as told by your health care provider.  Keep all follow-up visits as told by your health care provider. This is important. Contact a health care provider if:  Your hernia gets larger.  Your hernia becomes painful. Get help right away if:  Your hernia becomes  increasingly painful.  You have pain along with any of the following: ? Changes in skin color in the area of the hernia. ? Nausea. ? Vomiting. ? Fever. Summary  A ventral hernia is a bulge of tissue from inside the abdomen that pushes through a weak area of the muscles that form the front wall of the abdomen.  This condition is treated with surgery, which may be urgent depending on your hernia.  Do not lift anything that is heavier than 10 lb (4.5 kg), and follow activity instructions from your health care provider. This information is not intended to replace advice given to you by your health care provider. Make sure you discuss any questions you have with your health care provider. Document Released: 12/10/2011 Document Revised: 08/10/2015 Document Reviewed: 08/10/2015 Elsevier Interactive Patient Education  2018 [employer]  

## 2016-08-12 ENCOUNTER — Other Ambulatory Visit (HOSPITAL_COMMUNITY): Payer: Self-pay

## 2016-08-12 ENCOUNTER — Encounter (HOSPITAL_COMMUNITY): Payer: Self-pay

## 2016-10-01 DIAGNOSIS — J449 Chronic obstructive pulmonary disease, unspecified: Secondary | ICD-10-CM | POA: Diagnosis not present

## 2016-10-01 DIAGNOSIS — Z96659 Presence of unspecified artificial knee joint: Secondary | ICD-10-CM | POA: Diagnosis not present

## 2016-10-21 ENCOUNTER — Ambulatory Visit: Payer: Medicare HMO | Admitting: General Surgery

## 2016-10-31 DIAGNOSIS — J449 Chronic obstructive pulmonary disease, unspecified: Secondary | ICD-10-CM | POA: Diagnosis not present

## 2016-10-31 DIAGNOSIS — Z96659 Presence of unspecified artificial knee joint: Secondary | ICD-10-CM | POA: Diagnosis not present

## 2016-11-04 ENCOUNTER — Ambulatory Visit: Payer: Medicare HMO | Admitting: General Surgery

## 2016-11-11 DIAGNOSIS — F339 Major depressive disorder, recurrent, unspecified: Secondary | ICD-10-CM | POA: Diagnosis not present

## 2016-11-11 DIAGNOSIS — Z23 Encounter for immunization: Secondary | ICD-10-CM | POA: Diagnosis not present

## 2016-11-11 DIAGNOSIS — F17200 Nicotine dependence, unspecified, uncomplicated: Secondary | ICD-10-CM | POA: Diagnosis not present

## 2016-11-11 DIAGNOSIS — N3941 Urge incontinence: Secondary | ICD-10-CM | POA: Diagnosis not present

## 2016-11-11 DIAGNOSIS — J449 Chronic obstructive pulmonary disease, unspecified: Secondary | ICD-10-CM | POA: Diagnosis not present

## 2016-11-11 DIAGNOSIS — N390 Urinary tract infection, site not specified: Secondary | ICD-10-CM | POA: Diagnosis not present

## 2016-11-25 ENCOUNTER — Ambulatory Visit: Payer: Medicare HMO | Admitting: General Surgery

## 2016-11-25 ENCOUNTER — Encounter: Payer: Self-pay | Admitting: General Surgery

## 2016-11-25 VITALS — BP 169/68 | HR 80 | Temp 98.0°F | Resp 18 | Ht 62.0 in | Wt 176.0 lb

## 2016-11-25 DIAGNOSIS — K439 Ventral hernia without obstruction or gangrene: Secondary | ICD-10-CM | POA: Diagnosis not present

## 2016-11-25 MED ORDER — ONDANSETRON HCL 4 MG PO TABS: 4.0000 mg | ORAL_TABLET | Freq: Three times a day (TID) | ORAL | 0 refills | Status: DC | PRN

## 2016-11-25 NOTE — Progress Notes (Signed)
Subjective:     Kathryn Long  Patient is here for follow-up of her ventral hernia that was previously diagnosed.  She states she is becoming more nauseated and having some pain at the hernia site.  She denies any fever or chills.  She would like to schedule surgery now. Objective:    BP (!) 169/68   Pulse 80   Temp 98 F (36.7 C)   Resp 18   Ht 5\' 2"  (1.575 m)   Wt 176 lb (79.8 kg)   BMI 32.19 kg/m   General:  alert, cooperative and no distress  Head is normocephalic, atraumatic Lungs are clear to auscultation with equal breath sounds bilaterally Heart examination reveals a regular rate and rhythm without S3, S4, murmurs Abdomen is soft with the palpable upper right ventral hernia present.  It is somewhat reducible.  It is not hard or erythematous.  CT scan images personally reviewed     Assessment:    Ventral hernia    Plan:   Patient is scheduled for a ventral herniorrhaphy with mesh on 12/03/2016.  The risks and benefits of the procedure including bleeding, infection, mesh use, and the possibility of recurrence of the hernia were fully explained to the patient, who gave informed consent.

## 2016-11-25 NOTE — H&P (Signed)
Kathryn Long is an 76 y.o. female.   Chief Complaint: Ventral hernia HPI: Patient is a 76 year old black female with a known ventral hernia who now presents with worsening nausea and upper abdominal pain.  She did not state that the hernia has increased in size, but it is causing her more discomfort.  She currently has a pain level of 6 out of 10.  No fever or chills have been noted.  No emesis has been noted.  She does have a decreased appetite.  Past Medical History:  Diagnosis Date  . Anxiety   . Arthritis   . Depression   . Hypertension     Past Surgical History:  Procedure Laterality Date  . COLONOSCOPY N/A 03/03/2016   Procedure: COLONOSCOPY;  Surgeon: Daneil Dolin, MD;  Location: [facility];  Service: Endoscopy;  Laterality: N/A;  2:45pm  . TOTAL KNEE ARTHROPLASTY Right 2010    Family History  Problem Relation Age of Onset  . Breast cancer Sister   . Cancer Brother        unknown primary  . Colon cancer Sister        ?age  . Breast cancer Sister    Social History:  reports that she has been smoking.  she has never used smokeless tobacco. She reports that she does not drink alcohol or use drugs.  Allergies: No Known Allergies  No medications prior to admission.    No results found for this or any previous visit (from the past 48 hour(s)). No results found.  Review of Systems  Constitutional: Positive for malaise/fatigue.  HENT: Negative.   Eyes: Negative.   Respiratory: Positive for shortness of breath.   Cardiovascular: Negative.   Gastrointestinal: Positive for abdominal pain, heartburn and nausea. Negative for vomiting.  Genitourinary: Positive for frequency.  Musculoskeletal: Positive for back pain.  Skin: Negative.   Neurological: Negative.   Endo/Heme/Allergies: Negative.   Psychiatric/Behavioral: Negative.     There were no vitals taken for this visit. Physical Exam  Vitals reviewed. Constitutional: She is oriented to person, place, and  time. She appears well-developed and well-nourished.  Cardiovascular: Normal rate, regular rhythm and normal heart sounds. Exam reveals no gallop and no friction rub.  No murmur heard. Respiratory: Effort normal and breath sounds normal. No respiratory distress. She has no wheezes. She has no rales.  GI: Soft. Bowel sounds are normal. She exhibits no distension. There is tenderness. There is no rebound.  A partially reducible right upper quadrant ventral hernia is present.  It is soft.  Neurological: She is alert and oriented to person, place, and time.  Walks with a cane.  Skin: Skin is warm and dry.     Assessment/Plan Impression: Ventral hernia Plan: Patient is scheduled for a ventral herniorrhaphy with mesh on 12/03/2016.  The risks and benefits of the procedure including bleeding, infection, mesh use, and the possibility of recurrence of the hernia were fully explained to the patient, who gave informed consent.  Aviva Signs, MD 11/25/2016, 12:02 PM

## 2016-11-25 NOTE — Patient Instructions (Signed)
 Ventral Hernia A ventral hernia is a bulge of tissue from inside the abdomen that pushes through a weak area of the muscles that form the front wall of the abdomen. The tissues inside the abdomen are inside a sac (peritoneum). These tissues include the small intestine, large intestine, and the fatty tissue that covers the intestines (omentum). Sometimes, the bulge that forms a hernia contains intestines. Other hernias contain only fat. Ventral hernias do not go away without surgical treatment. There are several types of ventral hernias. You may have:  A hernia at an incision site from previous abdominal surgery (incisional hernia).  A hernia just above the belly button (epigastric hernia), or at the belly button (umbilical hernia). These types of hernias can develop from heavy lifting or straining.  A hernia that comes and goes (reducible hernia). It may be visible only when you lift or strain. This type of hernia can be pushed back into the abdomen (reduced).  A hernia that traps abdominal tissue inside the hernia (incarcerated hernia). This type of hernia does not reduce.  A hernia that cuts off blood flow to the tissues inside the hernia (strangulated hernia). The tissues can start to die if this happens. This is a very painful bulge that cannot be reduced. A strangulated hernia is a medical emergency.  What are the causes? This condition is caused by abdominal tissue putting pressure on an area of weakness in the abdominal muscles. What increases the risk? The following factors may make you more likely to develop this condition:  Being female.  Being 60 or older.  Being overweight or obese.  Having had previous abdominal surgery, especially if there was an infection after surgery.  Having had an injury to the abdominal wall.  Having had several pregnancies.  Having a buildup of fluid inside the abdomen (ascites).  What are the signs or symptoms? The only symptom of a ventral  hernia may be a painless bulge in the abdomen. A reducible hernia may be visible only when you strain, cough, or lift. Other symptoms may include:  Dull pain.  A feeling of pressure.  Signs and symptoms of a strangulated hernia may include:  Increasing pain.  Nausea and vomiting.  Pain when pressing on the hernia.  The skin over the hernia turning red or purple.  Constipation.  Blood in the stool (feces).  How is this diagnosed? This condition may be diagnosed based on:  Your symptoms.  Your medical history.  A physical exam. You may be asked to cough or strain while standing. These actions increase the pressure inside your abdomen and force the hernia through the opening in your muscles. Your health care provider may try to reduce the hernia by pressing on it.  Imaging studies, such as an ultrasound or CT scan.  How is this treated? This condition is treated with surgery. If you have a strangulated hernia, surgery is done as soon as possible. If your hernia is small and not incarcerated, you may be asked to lose some weight before surgery. Follow these instructions at home:  Follow instructions from your health care provider about eating or drinking restrictions.  If you are overweight, your health care provider may recommend that you increase your activity level and eat a healthier diet.  Do not lift anything that is heavier than 10 lb (4.5 kg).  Return to your normal activities as told by your health care provider. Ask your health care provider what activities are safe for you. You   may need to avoid activities that increase pressure on your hernia.  Take over-the-counter and prescription medicines only as told by your health care provider.  Keep all follow-up visits as told by your health care provider. This is important. Contact a health care provider if:  Your hernia gets larger.  Your hernia becomes painful. Get help right away if:  Your hernia becomes  increasingly painful.  You have pain along with any of the following: ? Changes in skin color in the area of the hernia. ? Nausea. ? Vomiting. ? Fever. Summary  A ventral hernia is a bulge of tissue from inside the abdomen that pushes through a weak area of the muscles that form the front wall of the abdomen.  This condition is treated with surgery, which may be urgent depending on your hernia.  Do not lift anything that is heavier than 10 lb (4.5 kg), and follow activity instructions from your health care provider. This information is not intended to replace advice given to you by your health care provider. Make sure you discuss any questions you have with your health care provider. Document Released: 12/10/2011 Document Revised: 08/10/2015 Document Reviewed: 08/10/2015 Elsevier Interactive Patient Education  2018 [employer]  

## 2016-11-26 NOTE — Patient Instructions (Signed)
[name]  11/26/2016     @PREFPERIOPPHARMACY @   Your procedure is scheduled on 12/03/2016.  Report to [facility] at 6:30 A.M.  Call this number if you have problems the morning of surgery:  [phone]   Remember:  Do not eat food or drink liquids after midnight.  Take these medicines the morning of surgery with A SIP OF WATER Xanax, Celexa, Flexeril, Duoneb, Lisinopril, Prilosec   Do not wear jewelry, make-up or nail polish.  Do not wear lotions, powders, or perfumes, or deoderant.  Do not shave 48 hours prior to surgery.  Men may shave face and neck.  Do not bring valuables to the hospital.  [facility] is not responsible for any belongings or valuables.  Contacts, dentures or bridgework may not be worn into surgery.  Leave your suitcase in the car.  After surgery it may be brought to your room.  For patients admitted to the hospital, discharge time will be determined by your treatment team.  Patients discharged the day of surgery will not be allowed to drive home.    Please read over the following fact sheets that you were given. Surgical Site Infection Prevention and Anesthesia Post-op Instructions     PATIENT INSTRUCTIONS POST-ANESTHESIA  IMMEDIATELY FOLLOWING SURGERY:  Do not drive or operate machinery for the first twenty four hours after surgery.  Do not make any important decisions for twenty four hours after surgery or while taking narcotic pain medications or sedatives.  If you develop intractable nausea and vomiting or a severe headache please notify your doctor immediately.  FOLLOW-UP:  Please make an appointment with your surgeon as instructed. You do not need to follow up with anesthesia unless specifically instructed to do so.  WOUND CARE INSTRUCTIONS (if applicable):  Keep a dry clean dressing on the anesthesia/puncture wound site if there is drainage.  Once the wound has quit draining you may leave it open to air.  Generally you should leave  the bandage intact for twenty four hours unless there is drainage.  If the epidural site drains for more than 36-48 hours please call the anesthesia department.  QUESTIONS?:  Please feel free to call your physician or the hospital operator if you have any questions, and they will be happy to assist you.      Hernia, Adult A hernia is the bulging of an organ or tissue through a weak spot in the muscles of the abdomen (abdominal wall). Hernias develop most often near the navel or groin. There are many kinds of hernias. Common kinds include:  Femoral hernia. This kind of hernia develops under the groin in the upper thigh area.  Inguinal hernia. This kind of hernia develops in the groin or scrotum.  Umbilical hernia. This kind of hernia develops near the navel.  Hiatal hernia. This kind of hernia causes part of the stomach to be pushed up into the chest.  Incisional hernia. This kind of hernia bulges through a scar from an abdominal surgery.  What are the causes? This condition may be caused by:  Heavy lifting.  Coughing over a long period of time.  Straining to have a bowel movement.  An incision made during an abdominal surgery.  A birth defect (congenital defect).  Excess weight or obesity.  Smoking.  Poor nutrition.  Cystic fibrosis.  Excess fluid in the abdomen.  Undescended testicles.  What are the signs or symptoms? Symptoms of a hernia include:  A lump on the abdomen. This  is the first sign of a hernia. The lump may become more obvious with standing, straining, or coughing. It may get bigger over time if it is not treated or if the condition causing it is not treated.  Pain. A hernia is usually painless, but it may become painful over time if treatment is delayed. The pain is usually dull and may get worse with standing or lifting heavy objects.  Sometimes a hernia gets tightly squeezed in the weak spot (strangulated) or stuck there (incarcerated) and causes  additional symptoms. These symptoms may include:  Vomiting.  Nausea.  Constipation.  Irritability.  How is this diagnosed? A hernia may be diagnosed with:  A physical exam. During the exam your health care provider may ask you to cough or to make a specific movement, because a hernia is usually more visible when you move.  Imaging tests. These can include: ? X-rays. ? Ultrasound. ? CT scan.  How is this treated? A hernia that is small and painless may not need to be treated. A hernia that is large or painful may be treated with surgery. Inguinal hernias may be treated with surgery to prevent incarceration or strangulation. Strangulated hernias are always treated with surgery, because lack of blood to the trapped organ or tissue can cause it to die. Surgery to treat a hernia involves pushing the bulge back into place and repairing the weak part of the abdomen. Follow these instructions at home:  Avoid straining.  Do not lift anything heavier than 10 lb (4.5 kg).  Lift with your leg muscles, not your back muscles. This helps avoid strain.  When coughing, try to cough gently.  Prevent constipation. Constipation leads to straining with bowel movements, which can make a hernia worse or cause a hernia repair to break down. You can prevent constipation by: ? Eating a high-fiber diet that includes plenty of fruits and vegetables. ? Drinking enough fluids to keep your urine clear or pale yellow. Aim to drink 6-8 glasses of water per day. ? Using a stool softener as directed by your health care provider.  Lose weight, if you are overweight.  Do not use any tobacco products, including cigarettes, chewing tobacco, or electronic cigarettes. If you need help quitting, ask your health care provider.  Keep all follow-up visits as directed by your health care provider. This is important. Your health care provider may need to monitor your condition. Contact a health care provider if:  You  have swelling, redness, and pain in the affected area.  Your bowel habits change. Get help right away if:  You have a fever.  You have abdominal pain that is getting worse.  You feel nauseous or you vomit.  You cannot push the hernia back in place by gently pressing on it while you are lying down.  The hernia: ? Changes in shape or size. ? Is stuck outside the abdomen. ? Becomes discolored. ? Feels hard or tender. This information is not intended to replace advice given to you by your health care provider. Make sure you discuss any questions you have with your health care provider. Document Released: 12/23/2004 Document Revised: 05/23/2015 Document Reviewed: 11/02/2013 [employer] Interactive Patient Education  2017 [employer].

## 2016-12-01 ENCOUNTER — Encounter (HOSPITAL_COMMUNITY)
Admission: RE | Admit: 2016-12-01 | Discharge: 2016-12-01 | Disposition: A | Discharge status: Home / Self Care | Payer: Medicare HMO | Source: Ambulatory Visit | Attending: General Surgery | Admitting: General Surgery

## 2016-12-01 DIAGNOSIS — J449 Chronic obstructive pulmonary disease, unspecified: Secondary | ICD-10-CM | POA: Diagnosis not present

## 2016-12-01 DIAGNOSIS — Z96659 Presence of unspecified artificial knee joint: Secondary | ICD-10-CM | POA: Diagnosis not present

## 2016-12-02 ENCOUNTER — Other Ambulatory Visit (HOSPITAL_COMMUNITY): Payer: Self-pay

## 2016-12-26 ENCOUNTER — Other Ambulatory Visit (HOSPITAL_COMMUNITY): Payer: Medicare HMO

## 2016-12-31 DIAGNOSIS — Z96659 Presence of unspecified artificial knee joint: Secondary | ICD-10-CM | POA: Diagnosis not present

## 2016-12-31 DIAGNOSIS — J449 Chronic obstructive pulmonary disease, unspecified: Secondary | ICD-10-CM | POA: Diagnosis not present

## 2017-01-19 NOTE — Patient Instructions (Addendum)
Kathryn Long  01/19/2017     @PREFPERIOPPHARMACY @   Your procedure is scheduled on  01/26/2017 .  Report to [facility] at  615   A.M.  Call this number if you have problems the morning of surgery:  [phone]   Remember:  Do not eat food or drink liquids after midnight.  Take these medicines the morning of surgery with A SIP OF WATER  Xanax, clexa, flexaril, lisinopril, prilosec. Use your nebulizer before you come.    Do not wear jewelry, make-up or nail polish.  Do not wear lotions, powders, or perfumes, or deodorant.  Do not shave 48 hours prior to surgery.  Men may shave face and neck.  Do not bring valuables to the hospital.  [facility] is not responsible for any belongings or valuables.  Contacts, dentures or bridgework may not be worn into surgery.  Leave your suitcase in the car.  After surgery it may be brought to your room.  For patients admitted to the hospital, discharge time will be determined by your treatment team.  Patients discharged the day of surgery will not be allowed to drive home.   Name and phone number of your driver:   family Special instructions:  None  Please read over the following fact sheets that you were given. Anesthesia Post-op Instructions and Care and Recovery After Surgery       Open Hernia Repair, Adult Open hernia repair is a surgical procedure to fix a hernia. A hernia occurs when an internal organ or tissue pushes out through a weak spot in the abdominal wall muscles. Hernias commonly occur in the groin and around the navel. Most hernias tend to get worse over time. Often, surgery is done to prevent the hernia from becoming bigger, uncomfortable, or an emergency. Emergency surgery may be needed if abdominal contents get stuck in the opening (incarcerated hernia) or the blood supply gets cut off (strangulated hernia). In an open repair, an incision is made in the abdomen to perform the surgery. Tell a health care  provider about:  Any allergies you have.  All medicines you are taking, including vitamins, herbs, eye drops, creams, and over-the-counter medicines.  Any problems you or family members have had with anesthetic medicines.  Any blood or bone disorders you have.  Any surgeries you have had.  Any medical conditions you have, including any recent cold or flu symptoms.  Whether you are pregnant or may be pregnant. What are the risks? Generally, this is a safe procedure. However, problems may occur, including:  Long-lasting (chronic) pain.  Bleeding.  Infection.  Damage to the testicle. This can cause shrinking or swelling.  Damage to the bladder, blood vessels, intestine, or nerves near the hernia.  Trouble passing urine.  Allergic reactions to medicines.  Return of the hernia.  What happens before the procedure? Staying hydrated Follow instructions from your health care provider about hydration, which may include:  Up to 2 hours before the procedure - you may continue to drink clear liquids, such as water, clear fruit juice, black coffee, and plain tea.  Eating and drinking restrictions Follow instructions from your health care provider about eating and drinking, which may include:  8 hours before the procedure - stop eating heavy meals or foods such as meat, fried foods, or fatty foods.  6 hours before the procedure - stop eating light meals or foods, such as toast or cereal.  6  hours before the procedure - stop drinking milk or drinks that contain milk.  2 hours before the procedure - stop drinking clear liquids.  Medicines  Ask your health care provider about: ? Changing or stopping your regular medicines. This is especially important if you are taking diabetes medicines or blood thinners. ? Taking medicines such as aspirin and ibuprofen. These medicines can thin your blood. Do not take these medicines before your procedure if your health care provider instructs  you not to.  You may be given antibiotic medicine to help prevent infection. General instructions  You may have blood tests or imaging studies.  Ask your health care provider how your surgical site will be marked or identified.  If you smoke, do not smoke for at least 2 weeks before your procedure or for as long as told by your health care provider.  Let your health care provider know if you develop a cold or any infection before your surgery.  Plan to have someone take you home from the hospital or clinic.  If you will be going home right after the procedure, plan to have someone with you for 24 hours. What happens during the procedure?  To reduce your risk of infection: ? Your health care team will wash or sanitize their hands. ? Your skin will be washed with soap. ? Hair may be removed from the surgical area.  An IV tube will be inserted into one of your veins.  You will be given one or more of the following: ? A medicine to help you relax (sedative). ? A medicine to numb the area (local anesthetic). ? A medicine to make you fall asleep (general anesthetic).  Your surgeon will make an incision over the hernia.  The tissues of the hernia will be moved back into place.  The edges of the hernia may be stitched together.  The opening in the abdominal muscles will be closed with stitches (sutures). Or, your surgeon will place a mesh patch made of manmade (synthetic) material over the opening.  The incision will be closed.  A bandage (dressing) may be placed over the incision. The procedure may vary among health care providers and hospitals. What happens after the procedure?  Your blood pressure, heart rate, breathing rate, and blood oxygen level will be monitored until the medicines you were given have worn off.  You may be given medicine for pain.  Do not drive for 24 hours if you received a sedative. This information is not intended to replace advice given to you by  your health care provider. Make sure you discuss any questions you have with your health care provider. Document Released: 06/18/2000 Document Revised: 07/13/2015 Document Reviewed: 06/06/2015 Elsevier Interactive Patient Education  2018 [place], Adult, Care After These instructions give you information about caring for yourself after your procedure. Your doctor may also give you more specific instructions. If you have problems or questions, contact your doctor. Follow these instructions at home: Surgical cut (incision) care   Follow instructions from your doctor about how to take care of your surgical cut area. Make sure you: ? Wash your hands with soap and water before you change your bandage (dressing). If you cannot use soap and water, use hand sanitizer. ? Change your bandage as told by your doctor. ? Leave stitches (sutures), skin glue, or skin tape (adhesive) strips in place. They may need to stay in place for 2 weeks or longer. If tape strips get  loose and curl up, you may trim the loose edges. Do not remove tape strips completely unless your doctor says it is okay.  Check your surgical cut every day for signs of infection. Check for: ? More redness, swelling, or pain. ? More fluid or blood. ? Warmth. ? Pus or a bad smell. Activity  Do not drive or use heavy machinery while taking prescription pain medicine. Do not drive until your doctor says it is okay.  Until your doctor says it is okay: ? Do not lift anything that is heavier than 10 lb (4.5 kg). ? Do not play contact sports.  Return to your normal activities as told by your doctor. Ask your doctor what activities are safe. General instructions  To prevent or treat having a hard time pooping (constipation) while you are taking prescription pain medicine, your doctor may recommend that you: ? Drink enough fluid to keep your pee (urine) clear or pale yellow. ? Take over-the-counter or prescription  medicines. ? Eat foods that are high in fiber, such as fresh fruits and vegetables, whole grains, and beans. ? Limit foods that are high in fat and processed sugars, such as fried and sweet foods.  Take over-the-counter and prescription medicines only as told by your doctor.  Do not take baths, swim, or use a hot tub until your doctor says it is okay.  Keep all follow-up visits as told by your doctor. This is important. Contact a doctor if:  You develop a rash.  You have more redness, swelling, or pain around your surgical cut.  You have more fluid or blood coming from your surgical cut.  Your surgical cut feels warm to the touch.  You have pus or a bad smell coming from your surgical cut.  You have a fever or chills.  You have blood in your poop (stool).  You have not pooped in 2-3 days.  Medicine does not help your pain. Get help right away if:  You have chest pain or you are short of breath.  You feel light-headed.  You feel weak and dizzy (feel faint).  You have very bad pain.  You throw up (vomit) and your pain is worse. This information is not intended to replace advice given to you by your health care provider. Make sure you discuss any questions you have with your health care provider. Document Released: 01/13/2014 Document Revised: 07/13/2015 Document Reviewed: 06/06/2015 Elsevier Interactive Patient Education  2018 [place] Anesthesia, Adult General anesthesia is the use of medicines to make a person "go to sleep" (be unconscious) for a medical procedure. General anesthesia is often recommended when a procedure:  Is long.  Requires you to be still or in an unusual position.  Is major and can cause you to lose blood.  Is impossible to do without general anesthesia.  The medicines used for general anesthesia are called general anesthetics. In addition to making you sleep, the medicines:  Prevent pain.  Control your blood  pressure.  Relax your muscles.  Tell a health care provider about:  Any allergies you have.  All medicines you are taking, including vitamins, herbs, eye drops, creams, and over-the-counter medicines.  Any problems you or family members have had with anesthetic medicines.  Types of anesthetics you have had in the past.  Any bleeding disorders you have.  Any surgeries you have had.  Any medical conditions you have.  Any history of heart or lung conditions, such as heart failure,  sleep apnea, or chronic obstructive pulmonary disease (COPD).  Whether you are pregnant or may be pregnant.  Whether you use tobacco, alcohol, marijuana, or street drugs.  Any history of Armed forces logistics/support/administrative officer.  Any history of depression or anxiety. What are the risks? Generally, this is a safe procedure. However, problems may occur, including:  Allergic reaction to anesthetics.  Lung and heart problems.  Inhaling food or liquids from your stomach into your lungs (aspiration).  Injury to nerves.  Waking up during your procedure and being unable to move (rare).  Extreme agitation or a state of mental confusion (delirium) when you wake up from the anesthetic.  Air in the bloodstream, which can lead to stroke.  These problems are more likely to develop if you are having a major surgery or if you have an advanced medical condition. You can prevent some of these complications by answering all of your health care provider's questions thoroughly and by following all pre-procedure instructions. General anesthesia can cause side effects, including:  Nausea or vomiting  A sore throat from the breathing tube.  Feeling cold or shivery.  Feeling tired, washed out, or achy.  Sleepiness or drowsiness.  Confusion or agitation.  What happens before the procedure? Staying hydrated Follow instructions from your health care provider about hydration, which may include:  Up to 2 hours before the procedure  - you may continue to drink clear liquids, such as water, clear fruit juice, black coffee, and plain tea.  Eating and drinking restrictions Follow instructions from your health care provider about eating and drinking, which may include:  8 hours before the procedure - stop eating heavy meals or foods such as meat, fried foods, or fatty foods.  6 hours before the procedure - stop eating light meals or foods, such as toast or cereal.  6 hours before the procedure - stop drinking milk or drinks that contain milk.  2 hours before the procedure - stop drinking clear liquids.  Medicines  Ask your health care provider about: ? Changing or stopping your regular medicines. This is especially important if you are taking diabetes medicines or blood thinners. ? Taking medicines such as aspirin and ibuprofen. These medicines can thin your blood. Do not take these medicines before your procedure if your health care provider instructs you not to. ? Taking new dietary supplements or medicines. Do not take these during the week before your procedure unless your health care provider approves them.  If you are told to take a medicine or to continue taking a medicine on the day of the procedure, take the medicine with sips of water. General instructions   Ask if you will be going home the same day, the following day, or after a longer hospital stay. ? Plan to have someone take you home. ? Plan to have someone stay with you for the first 24 hours after you leave the hospital or clinic.  For 3-6 weeks before the procedure, try not to use any tobacco products, such as cigarettes, chewing tobacco, and e-cigarettes.  You may brush your teeth on the morning of the procedure, but make sure to spit out the toothpaste. What happens during the procedure?  You will be given anesthetics through a mask and through an IV tube in one of your veins.  You may receive medicine to help you relax (sedative).  As soon  as you are asleep, a breathing tube may be used to help you breathe.  An anesthesia specialist will stay with  you throughout the procedure. He or she will help keep you comfortable and safe by continuing to give you medicines and adjusting the amount of medicine that you get. He or she will also watch your blood pressure, pulse, and oxygen levels to make sure that the anesthetics do not cause any problems.  If a breathing tube was used to help you breathe, it will be removed before you wake up. The procedure may vary among health care providers and hospitals. What happens after the procedure?  You will wake up, often slowly, after the procedure is complete, usually in a recovery area.  Your blood pressure, heart rate, breathing rate, and blood oxygen level will be monitored until the medicines you were given have worn off.  You may be given medicine to help you calm down if you feel anxious or agitated.  If you will be going home the same day, your health care provider may check to make sure you can stand, drink, and urinate.  Your health care providers will treat your pain and side effects before you go home.  Do not drive for 24 hours if you received a sedative.  You may: ? Feel nauseous and vomit. ? Have a sore throat. ? Have mental slowness. ? Feel cold or shivery. ? Feel sleepy. ? Feel tired. ? Feel sore or achy, even in parts of your body where you did not have surgery. This information is not intended to replace advice given to you by your health care provider. Make sure you discuss any questions you have with your health care provider. Document Released: 04/01/2007 Document Revised: 06/05/2015 Document Reviewed: 12/07/2014 Elsevier Interactive Patient Education  2018 [place] Anesthesia, Adult, Care After These instructions provide you with information about caring for yourself after your procedure. Your health care provider may also give you more specific  instructions. Your treatment has been planned according to current medical practices, but problems sometimes occur. Call your health care provider if you have any problems or questions after your procedure. What can I expect after the procedure? After the procedure, it is common to have:  Vomiting.  A sore throat.  Mental slowness.  It is common to feel:  Nauseous.  Cold or shivery.  Sleepy.  Tired.  Sore or achy, even in parts of your body where you did not have surgery.  Follow these instructions at home: For at least 24 hours after the procedure:  Do not: ? Participate in activities where you could fall or become injured. ? Drive. ? Use heavy machinery. ? Drink alcohol. ? Take sleeping pills or medicines that cause drowsiness. ? Make important decisions or sign legal documents. ? Take care of children on your own.  Rest. Eating and drinking  If you vomit, drink water, juice, or soup when you can drink without vomiting.  Drink enough fluid to keep your urine clear or pale yellow.  Make sure you have little or no nausea before eating solid foods.  Follow the diet recommended by your health care provider. General instructions  Have a responsible adult stay with you until you are awake and alert.  Return to your normal activities as told by your health care provider. Ask your health care provider what activities are safe for you.  Take over-the-counter and prescription medicines only as told by your health care provider.  If you smoke, do not smoke without supervision.  Keep all follow-up visits as told by your health care provider. This is important. Contact  a health care provider if:  You continue to have nausea or vomiting at home, and medicines are not helpful.  You cannot drink fluids or start eating again.  You cannot urinate after 8-12 hours.  You develop a skin rash.  You have fever.  You have increasing redness at the site of your  procedure. Get help right away if:  You have difficulty breathing.  You have chest pain.  You have unexpected bleeding.  You feel that you are having a life-threatening or urgent problem. This information is not intended to replace advice given to you by your health care provider. Make sure you discuss any questions you have with your health care provider. Document Released: 03/31/2000 Document Revised: 05/28/2015 Document Reviewed: 12/07/2014 Elsevier Interactive Patient Education  [employer].

## 2017-01-20 ENCOUNTER — Encounter (HOSPITAL_COMMUNITY): Payer: Self-pay | Admitting: Emergency Medicine

## 2017-01-20 ENCOUNTER — Emergency Department (HOSPITAL_COMMUNITY): Payer: Medicare HMO

## 2017-01-20 ENCOUNTER — Other Ambulatory Visit (HOSPITAL_COMMUNITY): Payer: Self-pay | Admitting: Internal Medicine

## 2017-01-20 ENCOUNTER — Other Ambulatory Visit: Payer: Self-pay

## 2017-01-20 ENCOUNTER — Emergency Department (HOSPITAL_COMMUNITY)
Admission: EM | Admit: 2017-01-20 | Discharge: 2017-01-20 | Disposition: A | Discharge status: Home / Self Care | Payer: Medicare HMO | Attending: Emergency Medicine | Admitting: Emergency Medicine

## 2017-01-20 DIAGNOSIS — Z79899 Other long term (current) drug therapy: Secondary | ICD-10-CM | POA: Diagnosis not present

## 2017-01-20 DIAGNOSIS — Z96651 Presence of right artificial knee joint: Secondary | ICD-10-CM | POA: Diagnosis not present

## 2017-01-20 DIAGNOSIS — S32010A Wedge compression fracture of first lumbar vertebra, initial encounter for closed fracture: Secondary | ICD-10-CM | POA: Insufficient documentation

## 2017-01-20 DIAGNOSIS — F419 Anxiety disorder, unspecified: Secondary | ICD-10-CM | POA: Diagnosis not present

## 2017-01-20 DIAGNOSIS — J449 Chronic obstructive pulmonary disease, unspecified: Secondary | ICD-10-CM | POA: Insufficient documentation

## 2017-01-20 DIAGNOSIS — I10 Essential (primary) hypertension: Secondary | ICD-10-CM | POA: Diagnosis not present

## 2017-01-20 DIAGNOSIS — Y999 Unspecified external cause status: Secondary | ICD-10-CM | POA: Diagnosis not present

## 2017-01-20 DIAGNOSIS — M545 Low back pain: Secondary | ICD-10-CM | POA: Diagnosis not present

## 2017-01-20 DIAGNOSIS — Y929 Unspecified place or not applicable: Secondary | ICD-10-CM | POA: Diagnosis not present

## 2017-01-20 DIAGNOSIS — M25511 Pain in right shoulder: Secondary | ICD-10-CM | POA: Diagnosis not present

## 2017-01-20 DIAGNOSIS — Y9301 Activity, walking, marching and hiking: Secondary | ICD-10-CM | POA: Insufficient documentation

## 2017-01-20 DIAGNOSIS — Z87891 Personal history of nicotine dependence: Secondary | ICD-10-CM | POA: Diagnosis not present

## 2017-01-20 DIAGNOSIS — W19XXXA Unspecified fall, initial encounter: Secondary | ICD-10-CM

## 2017-01-20 DIAGNOSIS — W0110XA Fall on same level from slipping, tripping and stumbling with subsequent striking against unspecified object, initial encounter: Secondary | ICD-10-CM | POA: Diagnosis not present

## 2017-01-20 DIAGNOSIS — S4991XA Unspecified injury of right shoulder and upper arm, initial encounter: Secondary | ICD-10-CM | POA: Diagnosis not present

## 2017-01-20 DIAGNOSIS — T148XXA Other injury of unspecified body region, initial encounter: Secondary | ICD-10-CM | POA: Diagnosis not present

## 2017-01-20 DIAGNOSIS — S3992XA Unspecified injury of lower back, initial encounter: Secondary | ICD-10-CM | POA: Diagnosis present

## 2017-01-20 LAB — URINALYSIS, ROUTINE W REFLEX MICROSCOPIC
Bilirubin Urine: NEGATIVE
GLUCOSE, UA: NEGATIVE mg/dL
HGB URINE DIPSTICK: NEGATIVE
Ketones, ur: NEGATIVE mg/dL
NITRITE: NEGATIVE
PH: 5 (ref 5.0–8.0)
PROTEIN: NEGATIVE mg/dL
Specific Gravity, Urine: 1.024 (ref 1.005–1.030)

## 2017-01-20 MED ORDER — KETOROLAC TROMETHAMINE 60 MG/2ML IM SOLN
60.0000 mg | Freq: Once | INTRAMUSCULAR | Status: AC
  Administered 2017-01-20: 60 mg via INTRAMUSCULAR
  Filled 2017-01-20: qty 2

## 2017-01-20 MED ORDER — HYDROCODONE-ACETAMINOPHEN 5-325 MG PO TABS: 1.0000 | ORAL_TABLET | ORAL | 0 refills | Status: DC | PRN

## 2017-01-20 MED ORDER — CYCLOBENZAPRINE HCL 10 MG PO TABS: 10.0000 mg | ORAL_TABLET | Freq: Two times a day (BID) | ORAL | 0 refills | Status: DC | PRN

## 2017-01-20 NOTE — ED Provider Notes (Addendum)
[facility] EMERGENCY DEPARTMENT Provider Note   CSN: [id number] Arrival date & time: 01/20/17  1649     History   Chief Complaint Chief Complaint  Patient presents with  . Back Pain    HPI Kathryn Long is a 77 y.o. female.  Status post fall approximately 2 weeks ago striking lower back and right shoulder.  No head trauma or neurological deficits.  No radicular symptoms.  Severity of pain is moderate.  Palpation and movement pink pain worse.      Past Medical History:  Diagnosis Date  . Anxiety   . Arthritis   . Bronchitis   . COPD (chronic obstructive pulmonary disease) ([place])   . Depression   . Hypertension     Patient Active Problem List   Diagnosis Date Noted  . Ventral hernia without obstruction or gangrene   . Bowel habit changes 02/22/2016  . FH: colon cancer 02/22/2016    Past Surgical History:  Procedure Laterality Date  . CATARACT EXTRACTION, BILATERAL    . COLONOSCOPY N/A 03/03/2016   Procedure: COLONOSCOPY;  Surgeon: Daneil Dolin, MD;  Location: [facility] ENDO SUITE;  Service: Endoscopy;  Laterality: N/A;  2:45pm  . TOTAL KNEE ARTHROPLASTY Right 2010  . VENTRAL HERNIA REPAIR N/A 01/26/2017   Procedure: HERNIA REPAIR VENTRAL ADULT WITH MESH;  Surgeon: Aviva Signs, MD;  Location: [facility] ORS;  Service: General;  Laterality: N/A;    OB History    No data available       Home Medications    Prior to Admission medications   Medication Sig Start Date End Date Taking? Authorizing Provider  acetaminophen (TYLENOL 8 HOUR ARTHRITIS PAIN) 650 MG CR tablet Take 1,300 mg by mouth every 8 (eight) hours as needed for pain.   Yes [provider]  ALPRAZolam Duanne Moron) 1 MG tablet Take 1 mg by mouth 2 (two) times daily. 02/14/16  Yes [provider]  citalopram (CELEXA) 20 MG tablet Take 20 mg by mouth daily. 02/14/16  Yes [provider]  ipratropium-albuterol (DUONEB) 0.5-2.5 (3) MG/3ML SOLN Take 3 mLs by nebulization 2 (two) times daily as  needed. Shortness of breath or wheezing   Yes [provider]  lisinopril (PRINIVIL,ZESTRIL) 10 MG tablet Take 10 mg by mouth daily.  09/22/16  Yes [provider]  Multiple Vitamin (MULTIVITAMIN WITH MINERALS) TABS tablet Take 1 tablet by mouth daily. When she remembers   Yes [provider]  SENNA LAXATIVE 8.6 MG tablet Take 1 tablet by mouth at bedtime as needed for constipation.  09/30/16  Yes [provider]  tetrahydrozoline 0.05 % ophthalmic solution Place 1 drop into both eyes daily as needed (Dry Eyes).   Yes [provider]  albuterol (PROVENTIL HFA;VENTOLIN HFA) 108 (90 Base) MCG/ACT inhaler Inhale 2 puffs into the lungs every 6 (six) hours as needed for wheezing or shortness of breath.    [provider]  cyclobenzaprine (FLEXERIL) 10 MG tablet Take 1 tablet (10 mg total) by mouth 2 (two) times daily as needed for muscle spasms. 02/10/17   Aviva Signs, MD  HYDROcodone-acetaminophen (NORCO) 5-325 MG tablet Take 1 tablet by mouth every 4 (four) hours as needed for moderate pain. 02/10/17   Aviva Signs, MD    Family History Family History  Problem Relation Age of Onset  . Breast cancer Sister   . Cancer Brother        unknown primary  . Colon cancer Sister        ?  age  . Breast cancer Sister     Social History Social History   Tobacco Use  . Smoking status: Former Smoker    Last attempt to quit: 01/06/2017    Years since quitting: 0.1  . Smokeless tobacco: Never Used  Substance Use Topics  . Alcohol use: No  . Drug use: No     Allergies   Ondansetron   Review of Systems Review of Systems  All other systems reviewed and are negative.    Physical Exam Updated Vital Signs BP (!) 157/62 (BP Location: Left Arm)   Pulse 72   Temp 98.3 F (36.8 C) (Oral)   Resp 18   Ht 5\' 3"  (1.6 m)   Wt 75.8 kg (167 lb)   SpO2 96%   BMI 29.58 kg/m   Physical Exam  Constitutional: She is oriented to person, place, and time.  She appears well-developed and well-nourished.  HENT:  Head: Normocephalic and atraumatic.  Eyes: Conjunctivae are normal.  Neck: Neck supple.  Cardiovascular: Normal rate and regular rhythm.  Pulmonary/Chest: Effort normal and breath sounds normal.  Abdominal: Soft. Bowel sounds are normal.  Musculoskeletal:  Tenderness peri-right shoulder and superior aspect of lumbar spine.  Neurological: She is alert and oriented to person, place, and time.  No obvious neurological deficits  Skin: Skin is warm and dry.  Psychiatric: She has a normal mood and affect. Her behavior is normal.  Nursing note and vitals reviewed.    ED Treatments / Results  Labs (all labs ordered are listed, but only abnormal results are displayed) Labs Reviewed  URINALYSIS, ROUTINE W REFLEX MICROSCOPIC - Abnormal; Notable for the following components:      Result Value   Leukocytes, UA SMALL (*)    Bacteria, UA RARE (*)    Squamous Epithelial / LPF 0-5 (*)    All other components within normal limits    EKG  EKG Interpretation None       Radiology No results found.  Procedures Procedures (including critical care time)  Medications Ordered in ED Medications  ketorolac (TORADOL) injection 60 mg (60 mg Intramuscular Given 01/20/17 2023)     Initial Impression / Assessment and Plan / ED Course  I have reviewed the triage vital signs and the nursing notes.  Pertinent labs & imaging results that were available during my care of the patient were reviewed by me and considered in my medical decision making (see chart for details).     Status post fall resulting in low back pain and right shoulder pain.  Plain films of lumbar spine reveal a moderate subacute L1 compression fracture.  Rx Vicodin, Flexeril 10 mg, discussed with patient and her daughter.  Will follow up with primary care.  Final Clinical Impressions(s) / ED Diagnoses   Final diagnoses:  Closed compression fracture of first lumbar  vertebra, initial encounter [facility])    ED Discharge Orders        Ordered    HYDROcodone-acetaminophen (NORCO/VICODIN) 5-325 MG tablet  Every 4 hours PRN,   Status:  Discontinued     01/20/17 2136    cyclobenzaprine (FLEXERIL) 10 MG tablet  2 times daily PRN,   Status:  Discontinued     01/20/17 2136       Nat Christen, MD 01/23/17 1621    Nat Christen, MD 02/16/17 1346

## 2017-01-20 NOTE — ED Triage Notes (Signed)
Pt fell 2 weeks ago because she was dizzy. Pt has had lower back and tail bone pain. Denies hitting head. No dizziness at this time.

## 2017-01-20 NOTE — Discharge Instructions (Signed)
You have a compression fracture of the lumbar #1 vertebrae.  Recommend back brace from [place] or drugstore.  Prescription for pain medicine and muscle relaxer.  Follow-up your primary care doctor.

## 2017-01-22 ENCOUNTER — Encounter (HOSPITAL_COMMUNITY)
Admission: RE | Admit: 2017-01-22 | Discharge: 2017-01-22 | Disposition: A | Discharge status: Home / Self Care | Payer: Medicare HMO | Source: Ambulatory Visit | Attending: General Surgery | Admitting: General Surgery

## 2017-01-22 ENCOUNTER — Other Ambulatory Visit: Payer: Self-pay

## 2017-01-22 ENCOUNTER — Encounter (HOSPITAL_COMMUNITY): Payer: Self-pay

## 2017-01-22 DIAGNOSIS — K439 Ventral hernia without obstruction or gangrene: Secondary | ICD-10-CM | POA: Insufficient documentation

## 2017-01-22 DIAGNOSIS — K429 Umbilical hernia without obstruction or gangrene: Secondary | ICD-10-CM | POA: Diagnosis present

## 2017-01-22 HISTORY — DX: Chronic obstructive pulmonary disease, unspecified: J44.9

## 2017-01-22 HISTORY — DX: Bronchitis, not specified as acute or chronic: J40

## 2017-01-22 LAB — BASIC METABOLIC PANEL
Anion gap: 10 (ref 5–15)
BUN: 20 mg/dL (ref 6–20)
CO2: 22 mmol/L (ref 22–32)
CREATININE: 1.1 mg/dL — AB (ref 0.44–1.00)
Calcium: 9.1 mg/dL (ref 8.9–10.3)
Chloride: 104 mmol/L (ref 101–111)
GFR, EST AFRICAN AMERICAN: 54 mL/min — AB (ref >60)
GFR, EST NON AFRICAN AMERICAN: 47 mL/min — AB (ref >60)
Glucose, Bld: 141 mg/dL — ABNORMAL HIGH (ref 65–99)
POTASSIUM: 3.9 mmol/L (ref 3.5–5.1)
SODIUM: 136 mmol/L (ref 135–145)

## 2017-01-22 LAB — CBC WITH DIFFERENTIAL/PLATELET
BASOS ABS: 0 10*3/uL (ref 0.0–0.1)
Basophils Relative: 0 %
EOS ABS: 0.3 10*3/uL (ref 0.0–0.7)
Eosinophils Relative: 3 %
HCT: 37.1 % (ref 36.0–46.0)
HEMOGLOBIN: 12.4 g/dL (ref 12.0–15.0)
LYMPHS ABS: 3.1 10*3/uL (ref 0.7–4.0)
Lymphocytes Relative: 32 %
MCH: 25.9 pg — ABNORMAL LOW (ref 26.0–34.0)
MCHC: 33.4 g/dL (ref 30.0–36.0)
MCV: 77.5 fL — ABNORMAL LOW (ref 78.0–100.0)
Monocytes Absolute: 0.5 10*3/uL (ref 0.1–1.0)
Monocytes Relative: 5 %
NEUTROS PCT: 60 %
Neutro Abs: 5.8 10*3/uL (ref 1.7–7.7)
Platelets: 506 10*3/uL — ABNORMAL HIGH (ref 150–400)
RBC: 4.79 MIL/uL (ref 3.87–5.11)
RDW: 16.6 % — ABNORMAL HIGH (ref 11.5–15.5)
WBC: 9.8 10*3/uL (ref 4.0–10.5)

## 2017-01-26 ENCOUNTER — Ambulatory Visit (HOSPITAL_COMMUNITY)
Admission: RE | Admit: 2017-01-26 | Discharge: 2017-01-26 | Disposition: A | Discharge status: Home / Self Care | Payer: Medicare HMO | Source: Ambulatory Visit | Attending: General Surgery | Admitting: General Surgery

## 2017-01-26 ENCOUNTER — Encounter (HOSPITAL_COMMUNITY): Payer: Self-pay | Admitting: Anesthesiology

## 2017-01-26 ENCOUNTER — Encounter (HOSPITAL_COMMUNITY)
Admission: RE | Disposition: A | Discharge status: Home / Self Care | Payer: Self-pay | Source: Ambulatory Visit | Attending: General Surgery

## 2017-01-26 ENCOUNTER — Ambulatory Visit (HOSPITAL_COMMUNITY): Payer: Medicare HMO | Admitting: Anesthesiology

## 2017-01-26 DIAGNOSIS — M1990 Unspecified osteoarthritis, unspecified site: Secondary | ICD-10-CM | POA: Diagnosis not present

## 2017-01-26 DIAGNOSIS — Z96651 Presence of right artificial knee joint: Secondary | ICD-10-CM | POA: Diagnosis not present

## 2017-01-26 DIAGNOSIS — K439 Ventral hernia without obstruction or gangrene: Secondary | ICD-10-CM

## 2017-01-26 DIAGNOSIS — F17210 Nicotine dependence, cigarettes, uncomplicated: Secondary | ICD-10-CM | POA: Diagnosis not present

## 2017-01-26 DIAGNOSIS — I10 Essential (primary) hypertension: Secondary | ICD-10-CM | POA: Diagnosis not present

## 2017-01-26 DIAGNOSIS — J449 Chronic obstructive pulmonary disease, unspecified: Secondary | ICD-10-CM | POA: Diagnosis not present

## 2017-01-26 HISTORY — PX: VENTRAL HERNIA REPAIR: SHX424

## 2017-01-26 SURGERY — REPAIR, HERNIA, VENTRAL
Anesthesia: General | Site: Abdomen

## 2017-01-26 MED ORDER — KETOROLAC TROMETHAMINE 30 MG/ML IJ SOLN
INTRAMUSCULAR | Status: AC
  Filled 2017-01-26: qty 1

## 2017-01-26 MED ORDER — LACTATED RINGERS IV SOLN
INTRAVENOUS | Status: DC
  Administered 2017-01-26: 07:00:00 via INTRAVENOUS

## 2017-01-26 MED ORDER — LIDOCAINE HCL (CARDIAC) 10 MG/ML IV SOLN
INTRAVENOUS | Status: DC | PRN
  Administered 2017-01-26: 10 mg via INTRAVENOUS
  Administered 2017-01-26: 30 mg via INTRAVENOUS

## 2017-01-26 MED ORDER — KETOROLAC TROMETHAMINE 30 MG/ML IJ SOLN
30.0000 mg | Freq: Once | INTRAMUSCULAR | Status: AC
  Administered 2017-01-26: 30 mg via INTRAVENOUS

## 2017-01-26 MED ORDER — POVIDONE-IODINE 10 % EX OINT
TOPICAL_OINTMENT | CUTANEOUS | Status: AC
  Filled 2017-01-26: qty 1

## 2017-01-26 MED ORDER — BUPIVACAINE LIPOSOME 1.3 % IJ SUSP
INTRAMUSCULAR | Status: DC | PRN
  Administered 2017-01-26: 20 mL

## 2017-01-26 MED ORDER — FENTANYL CITRATE (PF) 100 MCG/2ML IJ SOLN
25.0000 ug | INTRAMUSCULAR | Status: DC | PRN
  Administered 2017-01-26: 25 ug via INTRAVENOUS
  Administered 2017-01-26: 50 ug via INTRAVENOUS
  Filled 2017-01-26: qty 2

## 2017-01-26 MED ORDER — ROCURONIUM BROMIDE 50 MG/5ML IV SOLN
INTRAVENOUS | Status: AC
  Filled 2017-01-26: qty 1

## 2017-01-26 MED ORDER — 0.9 % SODIUM CHLORIDE (POUR BTL) OPTIME
TOPICAL | Status: DC | PRN
  Administered 2017-01-26: 1000 mL

## 2017-01-26 MED ORDER — POVIDONE-IODINE 10 % OINT PACKET
TOPICAL_OINTMENT | CUTANEOUS | Status: DC | PRN
  Administered 2017-01-26: 1 via TOPICAL

## 2017-01-26 MED ORDER — CEFAZOLIN SODIUM-DEXTROSE 2-4 GM/100ML-% IV SOLN
2.0000 g | INTRAVENOUS | Status: AC
  Administered 2017-01-26: 2 g via INTRAVENOUS
  Filled 2017-01-26: qty 100

## 2017-01-26 MED ORDER — CHLORHEXIDINE GLUCONATE CLOTH 2 % EX PADS: 6.0000 | MEDICATED_PAD | Freq: Once | CUTANEOUS | Status: DC

## 2017-01-26 MED ORDER — BUPIVACAINE LIPOSOME 1.3 % IJ SUSP
INTRAMUSCULAR | Status: AC
  Filled 2017-01-26: qty 20

## 2017-01-26 MED ORDER — FENTANYL CITRATE (PF) 100 MCG/2ML IJ SOLN
INTRAMUSCULAR | Status: DC | PRN
  Administered 2017-01-26: 25 ug via INTRAVENOUS
  Administered 2017-01-26: 50 ug via INTRAVENOUS
  Administered 2017-01-26: 25 ug via INTRAVENOUS

## 2017-01-26 MED ORDER — HYDROCODONE-ACETAMINOPHEN 5-325 MG PO TABS
1.0000 | ORAL_TABLET | ORAL | 0 refills | Status: DC | PRN
Start: 2017-01-26 — End: 2017-02-10

## 2017-01-26 MED ORDER — SODIUM CHLORIDE 0.9 % IJ SOLN
INTRAMUSCULAR | Status: AC
  Filled 2017-01-26: qty 10

## 2017-01-26 MED ORDER — EPHEDRINE SULFATE 50 MG/ML IJ SOLN
INTRAMUSCULAR | Status: AC
  Filled 2017-01-26: qty 1

## 2017-01-26 MED ORDER — MIDAZOLAM HCL 2 MG/2ML IJ SOLN
1.0000 mg | INTRAMUSCULAR | Status: AC
  Administered 2017-01-26: 2 mg via INTRAVENOUS
  Filled 2017-01-26: qty 2

## 2017-01-26 MED ORDER — FENTANYL CITRATE (PF) 250 MCG/5ML IJ SOLN
INTRAMUSCULAR | Status: AC
  Filled 2017-01-26: qty 5

## 2017-01-26 MED ORDER — SUCCINYLCHOLINE CHLORIDE 20 MG/ML IJ SOLN
INTRAMUSCULAR | Status: AC
  Filled 2017-01-26: qty 1

## 2017-01-26 MED ORDER — LIDOCAINE HCL (PF) 1 % IJ SOLN
INTRAMUSCULAR | Status: AC
  Filled 2017-01-26: qty 5

## 2017-01-26 MED ORDER — ROCURONIUM 10MG/ML (10ML) SYRINGE FOR MEDFUSION PUMP - OPTIME
INTRAVENOUS | Status: DC | PRN
  Administered 2017-01-26: 30 mg via INTRAVENOUS

## 2017-01-26 MED ORDER — PROPOFOL 10 MG/ML IV BOLUS
INTRAVENOUS | Status: DC | PRN
  Administered 2017-01-26: 90 mg via INTRAVENOUS

## 2017-01-26 MED ORDER — DEXAMETHASONE SODIUM PHOSPHATE 4 MG/ML IJ SOLN
4.0000 mg | Freq: Once | INTRAMUSCULAR | Status: AC
  Administered 2017-01-26: 4 mg via INTRAVENOUS
  Filled 2017-01-26: qty 1

## 2017-01-26 SURGICAL SUPPLY — 39 items
BAG HAMPER (MISCELLANEOUS) ×3 IMPLANT
CHLORAPREP W/TINT 26ML (MISCELLANEOUS) ×3 IMPLANT
CLOTH BEACON ORANGE TIMEOUT ST (SAFETY) ×3 IMPLANT
COVER LIGHT HANDLE STERIS (MISCELLANEOUS) ×6 IMPLANT
DRSG TEGADERM 4X4.75 (GAUZE/BANDAGES/DRESSINGS) ×2 IMPLANT
ELECT REM PT RETURN 9FT ADLT (ELECTROSURGICAL) ×3
ELECTRODE REM PT RTRN 9FT ADLT (ELECTROSURGICAL) ×1 IMPLANT
GAUZE SPONGE 4X4 12PLY STRL (GAUZE/BANDAGES/DRESSINGS) ×3 IMPLANT
GLOVE BIOGEL M 7.0 STRL (GLOVE) ×2 IMPLANT
GLOVE BIOGEL PI IND STRL 6.5 (GLOVE) IMPLANT
GLOVE BIOGEL PI IND STRL 7.0 (GLOVE) ×1 IMPLANT
GLOVE BIOGEL PI INDICATOR 6.5 (GLOVE) ×4
GLOVE BIOGEL PI INDICATOR 7.0 (GLOVE) ×4
GLOVE ECLIPSE 6.5 STRL STRAW (GLOVE) ×2 IMPLANT
GLOVE SURG SS PI 7.5 STRL IVOR (GLOVE) ×3 IMPLANT
GOWN STRL REUS W/TWL LRG LVL3 (GOWN DISPOSABLE) ×8 IMPLANT
INST SET MAJOR GENERAL (KITS) ×3 IMPLANT
KIT ROOM TURNOVER APOR (KITS) ×3 IMPLANT
MANIFOLD NEPTUNE II (INSTRUMENTS) ×3 IMPLANT
MESH VENTRALEX ST 2.5 CRC MED (Mesh General) ×3 IMPLANT
NS IRRIG 1000ML POUR BTL (IV SOLUTION) ×3 IMPLANT
PACK ABDOMINAL MAJOR (CUSTOM PROCEDURE TRAY) ×3 IMPLANT
PAD ARMBOARD 7.5X6 YLW CONV (MISCELLANEOUS) ×3 IMPLANT
SET BASIN LINEN APH (SET/KITS/TRAYS/PACK) ×3 IMPLANT
SPONGE GAUZE 2X2 8PLY STER LF (GAUZE/BANDAGES/DRESSINGS) ×2
SPONGE GAUZE 2X2 8PLY STRL LF (GAUZE/BANDAGES/DRESSINGS) ×2 IMPLANT
STAPLER VISISTAT (STAPLE) ×3 IMPLANT
SUT ETHIBOND NAB MO 7 #0 18IN (SUTURE) ×2 IMPLANT
SUT MNCRL AB 4-0 PS2 18 (SUTURE) IMPLANT
SUT NOVA NAB GS-21 1 T12 (SUTURE) IMPLANT
SUT NOVA NAB GS-22 2 2-0 T-19 (SUTURE) ×1 IMPLANT
SUT NOVA NAB GS-26 0 60 (SUTURE) IMPLANT
SUT SILK 2 0 (SUTURE)
SUT SILK 2-0 18XBRD TIE 12 (SUTURE) IMPLANT
SUT VIC AB 2-0 CT2 27 (SUTURE) ×2 IMPLANT
SUT VIC AB 3-0 SH 27 (SUTURE) ×3
SUT VIC AB 3-0 SH 27X BRD (SUTURE) IMPLANT
SUT VICRYL AB 2 0 TIES (SUTURE) ×1 IMPLANT
SYR 20CC LL (SYRINGE) ×3 IMPLANT

## 2017-01-26 NOTE — Op Note (Signed)
Patient:  Kathryn Long  DOB:  08/01/1940  MRN:  [id number]   Preop Diagnosis: Ventral hernia  Postop Diagnosis: Same  Procedure: Ventral herniorrhaphy with mesh  Surgeon: Aviva Signs, MD  Assistant: Blake Divine, MD  Anes: General endotracheal  Indications: Patient is a 77 year old white female who presents with a hernia just to the right of the midline, inferior to the subxiphoid process.  The risks and benefits of the procedure including bleeding, infection, mesh use, the possibility of recurrence of the hernia were fully explained to the patient, who gave informed consent.  Procedure note: The patient was placed in supine position.  After induction of general endotracheal anesthesia, the abdomen was prepped and draped using the usual sterile technique with DuraPrep.  Surgical site confirmation was performed.  A transverse incision was made starting at the midline into the right in the upper epigastric region.  The dissection was taken down to the fascia.  The hernia sac was found.  The neck of the hernia defect was only 2 cm in size.  In order to facilitate reduction, the adipose tissue within the hernia sac was excised using the LigaSure.  This was then reduced.  The anterior abdominal wall was then palpated no other hernia defects were noted.  A 6.4 cm Bard Ventralax st patch was inserted and secured with its tabs to the fascia using 0 Ethibond interrupted sutures.  The overlying fascia was reapproximated transversely using 0 Ethibond interrupted sutures.  The subcutaneous layer was reapproximated using 3-0 Vicryl interrupted sutures.  Exparel was instilled into the surrounding wound.  The skin was closed using staples.  Betadine ointment and dry sterile dressings were applied.  All tape and needle counts were correct at the end of the procedure.  The patient was extubated in the operating room and transferred to PACU in stable condition.  Complications: None  EBL:  Minimal  Specimen: None

## 2017-01-26 NOTE — Anesthesia Postprocedure Evaluation (Signed)
Anesthesia Post Note  Patient: Kathryn Long  Procedure(s) Performed: HERNIA REPAIR VENTRAL ADULT WITH MESH (N/A Abdomen)  Patient location during evaluation: PACU Anesthesia Type: General Level of consciousness: awake and alert and oriented Pain management: pain level controlled Vital Signs Assessment: post-procedure vital signs reviewed and stable Respiratory status: spontaneous breathing Cardiovascular status: blood pressure returned to baseline and stable Postop Assessment: no apparent nausea or vomiting Anesthetic complications: no     Last Vitals:  Vitals:   01/26/17 0725 01/26/17 0836  BP:    Pulse:    Resp: (!) 28   Temp:  36.8 C  SpO2: 98%     Last Pain:  Vitals:   01/26/17 0927  TempSrc:   PainSc: 3                  Gaddiel Cullens

## 2017-01-26 NOTE — Discharge Instructions (Signed)
Open Hernia Repair, Adult, Care After °This sheet gives you information about how to care for yourself after your procedure. Your health care provider may also give you more specific instructions. If you have problems or questions, contact your health care provider. °What can I expect after the procedure? °After the procedure, it is common to have: °· Mild discomfort. °· Slight bruising. °· Minor swelling. °· Pain in the abdomen. ° °Follow these instructions at home: °Incision care ° °· Follow instructions from your health care provider about how to take care of your incision area. Make sure you: °? Wash your hands with soap and water before you change your bandage (dressing). If soap and water are not available, use hand sanitizer. °? Change your dressing as told by your health care provider. °? Leave stitches (sutures), skin glue, or adhesive strips in place. These skin closures may need to stay in place for 2 weeks or longer. If adhesive strip edges start to loosen and curl up, you may trim the loose edges. Do not remove adhesive strips completely unless your health care provider tells you to do that. °· Check your incision area every day for signs of infection. Check for: °? More redness, swelling, or pain. °? More fluid or blood. °? Warmth. °? Pus or a bad smell. °Activity °· Do not drive or use heavy machinery while taking prescription pain medicine. Do not drive until your health care provider approves. °· Until your health care provider approves: °? Do not lift anything that is heavier than 10 lb (4.5 kg). °? Do not play contact sports. °· Return to your normal activities as told by your health care provider. Ask your health care provider what activities are safe. °General instructions °· To prevent or treat constipation while you are taking prescription pain medicine, your health care provider may recommend that you: °? Drink enough fluid to keep your urine clear or pale yellow. °? Take over-the-counter or  prescription medicines. °? Eat foods that are high in fiber, such as fresh fruits and vegetables, whole grains, and beans. °? Limit foods that are high in fat and processed sugars, such as fried and sweet foods. °· Take over-the-counter and prescription medicines only as told by your health care provider. °· Do not take tub baths or go swimming until your health care provider approves. °· Keep all follow-up visits as told by your health care provider. This is important. °Contact a health care provider if: °· You develop a rash. °· You have more redness, swelling, or pain around your incision. °· You have more fluid or blood coming from your incision. °· Your incision feels warm to the touch. °· You have pus or a bad smell coming from your incision. °· You have a fever or chills. °· You have blood in your stool (feces). °· You have not had a bowel movement in 2-3 days. °· Your pain is not controlled with medicine. °Get help right away if: °· You have chest pain or shortness of breath. °· You feel light-headed or feel faint. °· You have severe pain. °· You vomit and your pain is worse. °This information is not intended to replace advice given to you by your health care provider. Make sure you discuss any questions you have with your health care provider. °Document Released: 07/12/2004 Document Revised: 07/13/2015 Document Reviewed: 06/06/2015 °Elsevier Interactive Patient Education © 2018 [employer] ° °

## 2017-01-26 NOTE — Transfer of Care (Signed)
Immediate Anesthesia Transfer of Care Note  Patient: Kathryn Long  Procedure(s) Performed: HERNIA REPAIR VENTRAL ADULT WITH MESH (N/A Abdomen)  Patient Location: PACU  Anesthesia Type:General  Level of Consciousness: awake  Airway & Oxygen Therapy: Patient Spontanous Breathing and Patient connected to face mask oxygen  Post-op Assessment: Report given to RN  Post vital signs: Reviewed and stable  Last Vitals:  Vitals:   01/26/17 0725 01/26/17 0836  BP:    Pulse:    Resp: (!) 28   Temp:  (P) 36.8 C  SpO2: 98%     Last Pain:  Vitals:   01/26/17 0647  TempSrc: Oral  PainSc: 8       Patients Stated Pain Goal: 6 (98/92/11 9417)  Complications: No apparent anesthesia complications

## 2017-01-26 NOTE — Interval H&P Note (Signed)
History and Physical Interval Note:  01/26/2017 7:21 AM  Kathryn Long  has presented today for surgery, with the diagnosis of vetral hernia  The various methods of treatment have been discussed with the patient and family. After consideration of risks, benefits and other options for treatment, the patient has consented to  Procedure(s): HERNIA REPAIR VENTRAL ADULT WITH MESH (N/A) as a surgical intervention .  The patient's history has been reviewed, patient examined, no change in status, stable for surgery.  I have reviewed the patient's chart and labs.  Questions were answered to the patient's satisfaction.     Aviva Signs

## 2017-01-26 NOTE — Anesthesia Preprocedure Evaluation (Addendum)
Anesthesia Evaluation  Patient identified by MRN, date of birth, ID band Patient awake    Reviewed: Allergy & Precautions, NPO status , Patient's Chart, lab work & pertinent test results  Airway Mallampati: III  TM Distance: >3 FB Neck ROM: Full   Comment: Torus  Dental  (+) Edentulous Upper, Edentulous Lower   Pulmonary COPD,  COPD inhaler, former smoker,    breath sounds clear to auscultation       Cardiovascular hypertension, Pt. on medications  Rhythm:Regular Rate:Normal     Neuro/Psych PSYCHIATRIC DISORDERS (slow mentation) Anxiety Depression    GI/Hepatic negative GI ROS, Neg liver ROS,   Endo/Other  negative endocrine ROS  Renal/GU negative Renal ROS     Musculoskeletal  (+) Arthritis ,   Abdominal   Peds  Hematology negative hematology ROS (+)   Anesthesia Other Findings   Reproductive/Obstetrics                             Anesthesia Physical Anesthesia Plan  ASA: III  Anesthesia Plan: General   Post-op Pain Management:    Induction: Intravenous  PONV Risk Score and Plan:   Airway Management Planned: Oral ETT  Additional Equipment:   Intra-op Plan:   Post-operative Plan: Extubation in OR  Informed Consent: I have reviewed the patients History and Physical, chart, labs and discussed the procedure including the risks, benefits and alternatives for the proposed anesthesia with the patient or authorized representative who has indicated his/her understanding and acceptance.     Plan Discussed with:   Anesthesia Plan Comments:        Anesthesia Quick Evaluation

## 2017-01-26 NOTE — Anesthesia Procedure Notes (Signed)
Procedure Name: Intubation Date/Time: 01/26/2017 7:40 AM Performed by: Ollen Bowl, CRNA Pre-anesthesia Checklist: Patient identified, Patient being monitored, Timeout performed, Emergency Drugs available and Suction available Patient Re-evaluated:Patient Re-evaluated prior to induction Oxygen Delivery Method: Circle system utilized Preoxygenation: Pre-oxygenation with 100% oxygen Induction Type: IV induction, Rapid sequence and Cricoid Pressure applied Ventilation: Mask ventilation without difficulty Laryngoscope Size: Mac and 3 Grade View: Grade I Tube type: Oral Tube size: 7.0 mm Number of attempts: 1 Airway Equipment and Method: Stylet Placement Confirmation: ETT inserted through vocal cords under direct vision,  positive ETCO2 and breath sounds checked- equal and bilateral Secured at: 21 cm Tube secured with: Tape Dental Injury: Teeth and Oropharynx as per pre-operative assessment

## 2017-01-27 ENCOUNTER — Encounter (HOSPITAL_COMMUNITY): Payer: Self-pay | Admitting: General Surgery

## 2017-01-31 DIAGNOSIS — Z96659 Presence of unspecified artificial knee joint: Secondary | ICD-10-CM | POA: Diagnosis not present

## 2017-01-31 DIAGNOSIS — J449 Chronic obstructive pulmonary disease, unspecified: Secondary | ICD-10-CM | POA: Diagnosis not present

## 2017-02-10 ENCOUNTER — Ambulatory Visit (INDEPENDENT_AMBULATORY_CARE_PROVIDER_SITE_OTHER): Payer: Self-pay | Admitting: General Surgery

## 2017-02-10 ENCOUNTER — Encounter: Payer: Self-pay | Admitting: General Surgery

## 2017-02-10 VITALS — BP 152/74 | HR 81 | Temp 98.4°F | Ht 62.0 in | Wt 171.0 lb

## 2017-02-10 DIAGNOSIS — Z09 Encounter for follow-up examination after completed treatment for conditions other than malignant neoplasm: Secondary | ICD-10-CM

## 2017-02-10 MED ORDER — CYCLOBENZAPRINE HCL 10 MG PO TABS: 10.0000 mg | ORAL_TABLET | Freq: Two times a day (BID) | ORAL | 0 refills | Status: DC | PRN

## 2017-02-10 MED ORDER — HYDROCODONE-ACETAMINOPHEN 5-325 MG PO TABS: 1.0000 | ORAL_TABLET | ORAL | 0 refills | Status: DC | PRN

## 2017-02-10 NOTE — Progress Notes (Signed)
Subjective:     Kathryn Long  Status post ventral herniorrhaphy with mesh.  Doing well.  Has some mild incisional pain with spasms. Objective:    BP (!) 152/74   Pulse 81   Temp 98.4 F (36.9 C)   Ht 5\' 2"  (1.575 m)   Wt 171 lb (77.6 kg)   BMI 31.28 kg/m   General:  alert, cooperative and no distress  Abdomen soft, incision healing well.  Staples removed, Steri-Strips applied.     Assessment:    Doing well postoperatively.    Plan:  I did reorder her Flexeril and hydrocodone.  I told her that this was the final prescription for both of these.  Follow-up as needed.

## 2017-03-03 DIAGNOSIS — J449 Chronic obstructive pulmonary disease, unspecified: Secondary | ICD-10-CM | POA: Diagnosis not present

## 2017-03-03 DIAGNOSIS — Z96659 Presence of unspecified artificial knee joint: Secondary | ICD-10-CM | POA: Diagnosis not present

## 2017-03-23 ENCOUNTER — Other Ambulatory Visit (HOSPITAL_COMMUNITY): Payer: Self-pay | Admitting: Internal Medicine

## 2017-03-23 DIAGNOSIS — Z780 Asymptomatic menopausal state: Secondary | ICD-10-CM

## 2017-03-30 ENCOUNTER — Other Ambulatory Visit (HOSPITAL_COMMUNITY): Payer: Self-pay

## 2017-03-30 ENCOUNTER — Encounter (HOSPITAL_COMMUNITY): Payer: Self-pay

## 2017-03-31 DIAGNOSIS — J449 Chronic obstructive pulmonary disease, unspecified: Secondary | ICD-10-CM | POA: Diagnosis not present

## 2017-03-31 DIAGNOSIS — Z96659 Presence of unspecified artificial knee joint: Secondary | ICD-10-CM | POA: Diagnosis not present

## 2017-05-01 DIAGNOSIS — Z96659 Presence of unspecified artificial knee joint: Secondary | ICD-10-CM | POA: Diagnosis not present

## 2017-05-01 DIAGNOSIS — J449 Chronic obstructive pulmonary disease, unspecified: Secondary | ICD-10-CM | POA: Diagnosis not present

## 2017-05-14 DIAGNOSIS — J449 Chronic obstructive pulmonary disease, unspecified: Secondary | ICD-10-CM | POA: Diagnosis not present

## 2017-05-14 DIAGNOSIS — F339 Major depressive disorder, recurrent, unspecified: Secondary | ICD-10-CM | POA: Diagnosis not present

## 2017-05-14 DIAGNOSIS — M1990 Unspecified osteoarthritis, unspecified site: Secondary | ICD-10-CM | POA: Diagnosis not present

## 2017-05-14 DIAGNOSIS — F419 Anxiety disorder, unspecified: Secondary | ICD-10-CM | POA: Diagnosis not present

## 2017-05-22 ENCOUNTER — Other Ambulatory Visit (HOSPITAL_COMMUNITY): Payer: Self-pay | Admitting: Orthopedic Surgery

## 2017-05-22 DIAGNOSIS — M25511 Pain in right shoulder: Secondary | ICD-10-CM | POA: Diagnosis not present

## 2017-05-28 ENCOUNTER — Ambulatory Visit (HOSPITAL_COMMUNITY): Payer: Medicare HMO

## 2017-05-28 ENCOUNTER — Encounter (HOSPITAL_COMMUNITY): Payer: Self-pay

## 2017-05-31 DIAGNOSIS — J449 Chronic obstructive pulmonary disease, unspecified: Secondary | ICD-10-CM | POA: Diagnosis not present

## 2017-05-31 DIAGNOSIS — Z96659 Presence of unspecified artificial knee joint: Secondary | ICD-10-CM | POA: Diagnosis not present

## 2017-06-08 ENCOUNTER — Ambulatory Visit (HOSPITAL_COMMUNITY)
Admission: RE | Admit: 2017-06-08 | Discharge: 2017-06-08 | Disposition: A | Discharge status: Home / Self Care | Payer: Medicare HMO | Source: Ambulatory Visit | Attending: Orthopedic Surgery | Admitting: Orthopedic Surgery

## 2017-06-08 DIAGNOSIS — M25511 Pain in right shoulder: Secondary | ICD-10-CM | POA: Insufficient documentation

## 2017-06-08 DIAGNOSIS — M75101 Unspecified rotator cuff tear or rupture of right shoulder, not specified as traumatic: Secondary | ICD-10-CM | POA: Insufficient documentation

## 2017-06-08 DIAGNOSIS — M12811 Other specific arthropathies, not elsewhere classified, right shoulder: Secondary | ICD-10-CM | POA: Diagnosis not present

## 2017-06-23 DIAGNOSIS — M25511 Pain in right shoulder: Secondary | ICD-10-CM | POA: Diagnosis not present

## 2017-07-01 DIAGNOSIS — J449 Chronic obstructive pulmonary disease, unspecified: Secondary | ICD-10-CM | POA: Diagnosis not present

## 2017-07-01 DIAGNOSIS — Z96659 Presence of unspecified artificial knee joint: Secondary | ICD-10-CM | POA: Diagnosis not present

## 2017-07-15 ENCOUNTER — Encounter: Payer: Self-pay | Admitting: Cardiovascular Disease

## 2017-07-15 ENCOUNTER — Encounter: Payer: Self-pay | Admitting: *Deleted

## 2017-07-15 ENCOUNTER — Telehealth: Payer: Self-pay | Admitting: Cardiovascular Disease

## 2017-07-15 ENCOUNTER — Ambulatory Visit: Payer: Medicare HMO | Admitting: Cardiovascular Disease

## 2017-07-15 VITALS — BP 112/60 | HR 60 | Ht 62.0 in | Wt 171.0 lb

## 2017-07-15 DIAGNOSIS — R011 Cardiac murmur, unspecified: Secondary | ICD-10-CM | POA: Diagnosis not present

## 2017-07-15 DIAGNOSIS — J449 Chronic obstructive pulmonary disease, unspecified: Secondary | ICD-10-CM | POA: Diagnosis not present

## 2017-07-15 DIAGNOSIS — Z01818 Encounter for other preprocedural examination: Secondary | ICD-10-CM | POA: Diagnosis not present

## 2017-07-15 DIAGNOSIS — I10 Essential (primary) hypertension: Secondary | ICD-10-CM | POA: Diagnosis not present

## 2017-07-15 DIAGNOSIS — R0609 Other forms of dyspnea: Secondary | ICD-10-CM

## 2017-07-15 DIAGNOSIS — E7800 Pure hypercholesterolemia, unspecified: Secondary | ICD-10-CM

## 2017-07-15 NOTE — Telephone Encounter (Signed)
lexiscan and Echo scheduled at [facility] July 24, 2017

## 2017-07-15 NOTE — Patient Instructions (Signed)
Medication Instructions:  Continue all current medications.  Labwork: none  Testing/Procedures:  Your physician has requested that you have an echocardiogram. Echocardiography is a painless test that uses sound waves to create images of your heart. It provides your doctor with information about the size and shape of your heart and how well your heart's chambers and valves are working. This procedure takes approximately one hour. There are no restrictions for this procedure.  Your physician has requested that you have a lexiscan myoview. For further information please visit [email or web address]. Please follow instruction sheet, as given.  Office will contact with results via phone or letter.    Follow-Up: 6-8 wks   Any Other Special Instructions Will Be Listed Below (If Applicable).  If you need a refill on your cardiac medications before your next appointment, please call your pharmacy.

## 2017-07-15 NOTE — Progress Notes (Signed)
CARDIOLOGY CONSULT NOTE  Patient ID: Kathryn Long MRN: [id number] DOB/AGE: 14-Mar-1940 77 y.o.  Admit date: (Not on file) Primary Physician: Rosita Fire, MD Referring Physician: Rosita Fire, MD  Reason for Consultation: Preoperative risk stratification  HPI: Kathryn Long is a 77 y.o. female who is being seen today for the evaluation of preoperative risk stratification at the request of Rosita Fire, MD.   I reviewed records from her PCP.  She is being scheduled for right total shoulder replacement.  She has a history of hypertension and COPD.  She also has hypercholesterolemia.  I reviewed labs dated 08/08/2016: Total cholesterol 241, HDL 65, triglycerides 82, LDL 157, BUN 13, creatinine 1.12, sodium 142, potassium 5.3, hemoglobin A1c 6.1%, white blood cells 10.1, hemoglobin 12.8, platelets 514.  ECG performed in the office today which I ordered and personally interpreted demonstrates normal sinus rhythm with no ischemic ST segment or T-wave abnormalities, nor any arrhythmias.  She is here with her daughter, [name].  She has chronic exertional dyspnea and does not get a lot of activity.  She has not climb stairs in several years.  She denies exertional chest pain and does denies a history of heart disease.  She underwent hernia surgery on 01/26/2017 by Dr. Arnoldo Morale and did well.  She smokes about 2 to 3 cigarettes/day.  She had quit for 6 months at one time.  Family history: Mother had a stent in her late 50s/early 64s.  She died at the age of 79.   Allergies  Allergen Reactions  . Ondansetron Nausea And Vomiting    Made nausea, vomiting worse headache    Current Outpatient Medications  Medication Sig Dispense Refill  . ALPRAZolam (XANAX) 1 MG tablet Take 1 mg by mouth 2 (two) times daily.  3  . lisinopril (PRINIVIL,ZESTRIL) 10 MG tablet Take 10 mg by mouth daily.     . Multiple Vitamin (MULTIVITAMIN WITH MINERALS) TABS tablet Take 1 tablet by  mouth daily. When she remembers     No current facility-administered medications for this visit.     Past Medical History:  Diagnosis Date  . Anxiety   . Arthritis   . Bronchitis   . COPD (chronic obstructive pulmonary disease) ([place])   . Depression   . Hypertension     Past Surgical History:  Procedure Laterality Date  . CATARACT EXTRACTION, BILATERAL    . COLONOSCOPY N/A 03/03/2016   Procedure: COLONOSCOPY;  Surgeon: Daneil Dolin, MD;  Location: [facility];  Service: Endoscopy;  Laterality: N/A;  2:45pm  . TOTAL KNEE ARTHROPLASTY Right 2010  . VENTRAL HERNIA REPAIR N/A 01/26/2017   Procedure: HERNIA REPAIR VENTRAL ADULT WITH MESH;  Surgeon: Aviva Signs, MD;  Location: [facility] ORS;  Service: General;  Laterality: N/A;    Social History   Socioeconomic History  . Marital status: Single    Spouse name: Not on file  . Number of children: Not on file  . Years of education: Not on file  . Highest education level: Not on file  Occupational History  . Occupation: retired  Scientific laboratory technician  . Financial resource strain: Not on file  . Food insecurity:    Worry: Not on file    Inability: Not on file  . Transportation needs:    Medical: Not on file    Non-medical: Not on file  Tobacco Use  . Smoking status: Current Every Day Smoker    Types: Cigarettes    Last  attempt to quit: 01/06/2017    Years since quitting: 0.5  . Smokeless tobacco: Never Used  Substance and Sexual Activity  . Alcohol use: No  . Drug use: No  . Sexual activity: Not on file  Lifestyle  . Physical activity:    Days per week: Not on file    Minutes per session: Not on file  . Stress: Not on file  Relationships  . Social connections:    Talks on phone: Not on file    Gets together: Not on file    Attends religious service: Not on file    Active member of club or organization: Not on file    Attends meetings of clubs or organizations: Not on file    Relationship status: Not on file  . Intimate partner  violence:    Fear of current or ex partner: Not on file    Emotionally abused: Not on file    Physically abused: Not on file    Forced sexual activity: Not on file  Other Topics Concern  . Not on file  Social History Narrative  . Not on file      Current Meds  Medication Sig  . ALPRAZolam (XANAX) 1 MG tablet Take 1 mg by mouth 2 (two) times daily.  Marland Kitchen lisinopril (PRINIVIL,ZESTRIL) 10 MG tablet Take 10 mg by mouth daily.   . Multiple Vitamin (MULTIVITAMIN WITH MINERALS) TABS tablet Take 1 tablet by mouth daily. When she remembers      Review of systems complete and found to be negative unless listed above in HPI    Physical exam Blood pressure 112/60, pulse 60, height 5\' 2"  (1.575 m), weight 171 lb (77.6 kg), SpO2 98 %. General: NAD Neck: No JVD, no thyromegaly or thyroid nodule.  Lungs: Clear to auscultation bilaterally with normal respiratory effort. CV: Nondisplaced PMI. Regular rate and rhythm, normal S1/S2, no Y7/C6, soft systolic murmur loudest over right upper sternal border.  No peripheral edema.  No carotid bruit.   Abdomen: Soft, nontender, no distention.  Skin: Intact without lesions or rashes.  Neurologic: Alert and oriented x 3.  Psych: Normal affect. Extremities: No clubbing or cyanosis.  HEENT: Normal.   ECG: Most recent ECG reviewed.   Labs: Lab Results  Component Value Date/Time   K 3.9 01/22/2017 03:38 PM   BUN 20 01/22/2017 03:38 PM   CREATININE 1.10 (H) 01/22/2017 03:38 PM   ALT 11 (L) 03/13/2016 05:36 AM   HGB 12.4 01/22/2017 03:38 PM     Lipids: No results found for: LDLCALC, LDLDIRECT, CHOL, TRIG, HDL      ASSESSMENT AND PLAN:  1.  Preoperative risk stratification: I am unable to assess her exercise tolerance based on history.  She does not get much activity.  She does have some exertional dyspnea which may be attributable to COPD and many years of tobacco abuse.  Her mother did undergo coronary artery stenting in her late 50s/early  65s. I will proceed with a nuclear myocardial perfusion imaging study to evaluate for ischemic heart disease (Lexiscan Myoview).  2.  Hypertension: Blood pressure is normal.  Continue lisinopril 10 mg.  3.  Hypercholesterolemia: This appears to be diet controlled.  4.  COPD: Stable exertional dyspnea.  She has a long history of tobacco abuse.  5.  Cardiac murmur: I will order a 2-D echocardiogram with Doppler to evaluate cardiac structure, function, and regional wall motion.    Disposition: Follow up in 6-8 weeks   Signed: Kate Sable,  M.D., F.A.C.C.  07/15/2017, 3:06 PM

## 2017-07-15 NOTE — Progress Notes (Signed)
ekg 

## 2017-07-23 ENCOUNTER — Ambulatory Visit: Payer: Self-pay | Admitting: Cardiology

## 2017-07-23 ENCOUNTER — Encounter

## 2017-07-24 ENCOUNTER — Encounter (HOSPITAL_COMMUNITY): Payer: Medicare HMO

## 2017-07-24 ENCOUNTER — Ambulatory Visit (HOSPITAL_COMMUNITY): Payer: Medicare HMO

## 2017-07-24 ENCOUNTER — Ambulatory Visit (HOSPITAL_COMMUNITY): Payer: Medicare HMO | Attending: Cardiovascular Disease

## 2017-07-31 DIAGNOSIS — Z96659 Presence of unspecified artificial knee joint: Secondary | ICD-10-CM | POA: Diagnosis not present

## 2017-07-31 DIAGNOSIS — J449 Chronic obstructive pulmonary disease, unspecified: Secondary | ICD-10-CM | POA: Diagnosis not present

## 2017-08-14 ENCOUNTER — Encounter (HOSPITAL_COMMUNITY): Payer: Self-pay

## 2017-08-14 ENCOUNTER — Ambulatory Visit (HOSPITAL_COMMUNITY): Payer: Medicare HMO

## 2017-08-14 ENCOUNTER — Ambulatory Visit (HOSPITAL_COMMUNITY): Payer: Medicare HMO | Attending: Cardiovascular Disease

## 2017-08-17 ENCOUNTER — Ambulatory Visit: Payer: Self-pay | Admitting: Physician Assistant

## 2017-08-17 DIAGNOSIS — F329 Major depressive disorder, single episode, unspecified: Secondary | ICD-10-CM | POA: Diagnosis not present

## 2017-08-17 DIAGNOSIS — E785 Hyperlipidemia, unspecified: Secondary | ICD-10-CM | POA: Diagnosis not present

## 2017-08-17 DIAGNOSIS — F17200 Nicotine dependence, unspecified, uncomplicated: Secondary | ICD-10-CM | POA: Diagnosis not present

## 2017-08-17 DIAGNOSIS — Z1331 Encounter for screening for depression: Secondary | ICD-10-CM | POA: Diagnosis not present

## 2017-08-17 DIAGNOSIS — I973 Postprocedural hypertension: Secondary | ICD-10-CM | POA: Diagnosis not present

## 2017-08-17 DIAGNOSIS — I10 Essential (primary) hypertension: Secondary | ICD-10-CM | POA: Diagnosis not present

## 2017-08-17 DIAGNOSIS — J449 Chronic obstructive pulmonary disease, unspecified: Secondary | ICD-10-CM | POA: Diagnosis not present

## 2017-08-17 DIAGNOSIS — Z0000 Encounter for general adult medical examination without abnormal findings: Secondary | ICD-10-CM | POA: Diagnosis not present

## 2017-08-17 DIAGNOSIS — F339 Major depressive disorder, recurrent, unspecified: Secondary | ICD-10-CM | POA: Diagnosis not present

## 2017-08-17 DIAGNOSIS — F17210 Nicotine dependence, cigarettes, uncomplicated: Secondary | ICD-10-CM | POA: Diagnosis not present

## 2017-08-17 DIAGNOSIS — Z0001 Encounter for general adult medical examination with abnormal findings: Secondary | ICD-10-CM | POA: Diagnosis not present

## 2017-08-17 DIAGNOSIS — Z1389 Encounter for screening for other disorder: Secondary | ICD-10-CM | POA: Diagnosis not present

## 2017-08-17 DIAGNOSIS — F411 Generalized anxiety disorder: Secondary | ICD-10-CM | POA: Diagnosis not present

## 2017-08-17 DIAGNOSIS — R739 Hyperglycemia, unspecified: Secondary | ICD-10-CM | POA: Diagnosis not present

## 2017-08-31 DIAGNOSIS — Z96659 Presence of unspecified artificial knee joint: Secondary | ICD-10-CM | POA: Diagnosis not present

## 2017-08-31 DIAGNOSIS — J449 Chronic obstructive pulmonary disease, unspecified: Secondary | ICD-10-CM | POA: Diagnosis not present

## 2017-09-04 ENCOUNTER — Ambulatory Visit: Payer: Self-pay | Admitting: Physician Assistant

## 2017-09-04 NOTE — Progress Notes (Deleted)
Cardiology Office Note    Date:  09/04/2017  ID:  Kathryn Long, Kathryn Long 19-Apr-1940, MRN [id number] PCP:  Rosita Fire, MD  Cardiologist:  Kate Sable, MD  Chief Complaint: dyspnea on exertion   History of Present Illness:  Kathryn Long is a 77 y.o. female with history of HTN, COPD, HLD, anxiety, arthritis, depression, tobacco abuse who recently saw Dr. Bronson Ing for preop shoulder surgery and reported chronic dyspnea on exertion. Lexiscan and echo were ordered, but do not appear to be performed yet. Last labs 01/2017 showed Hgb Hgb 12.4, Plt 506, K 3.9, Cr 1.10.   Exertional dyspnea Pre-op cardiovascular exam Hypertension Hyperlipidemia   Past Medical History:  Diagnosis Date  . Anxiety   . Arthritis   . Bronchitis   . COPD (chronic obstructive pulmonary disease) ([place])   . Depression   . Hypertension     Past Surgical History:  Procedure Laterality Date  . CATARACT EXTRACTION, BILATERAL    . COLONOSCOPY N/A 03/03/2016   Procedure: COLONOSCOPY;  Surgeon: Daneil Dolin, MD;  Location: [facility];  Service: Endoscopy;  Laterality: N/A;  2:45pm  . TOTAL KNEE ARTHROPLASTY Right 2010  . VENTRAL HERNIA REPAIR N/A 01/26/2017   Procedure: HERNIA REPAIR VENTRAL ADULT WITH MESH;  Surgeon: Aviva Signs, MD;  Location: [facility];  Service: General;  Laterality: N/A;    Current Medications: No outpatient medications have been marked as taking for the 09/04/17 encounter (Appointment) with Charlie Pitter, PA-C.   ***   Allergies:   Ondansetron   Social History   Socioeconomic History  . Marital status: Single    Spouse name: Not on file  . Number of children: Not on file  . Years of education: Not on file  . Highest education level: Not on file  Occupational History  . Occupation: retired  Scientific laboratory technician  . Financial resource strain: Not on file  . Food insecurity:    Worry: Not on file    Inability: Not on file  . Transportation needs:    Medical: Not on  file    Non-medical: Not on file  Tobacco Use  . Smoking status: Current Every Day Smoker    Types: Cigarettes    Last attempt to quit: 01/06/2017    Years since quitting: 0.6  . Smokeless tobacco: Never Used  Substance and Sexual Activity  . Alcohol use: No  . Drug use: No  . Sexual activity: Not on file  Lifestyle  . Physical activity:    Days per week: Not on file    Minutes per session: Not on file  . Stress: Not on file  Relationships  . Social connections:    Talks on phone: Not on file    Gets together: Not on file    Attends religious service: Not on file    Active member of club or organization: Not on file    Attends meetings of clubs or organizations: Not on file    Relationship status: Not on file  Other Topics Concern  . Not on file  Social History Narrative  . Not on file     Family History:  The patient's ***family history includes Breast cancer in her sister and sister; Cancer in her brother; Colon cancer in her sister.  ROS:   Please see the history of present illness. Otherwise, review of systems is positive for ***.  All other systems are reviewed and otherwise negative.    PHYSICAL EXAM:   VS:  There were no vitals taken for this visit.  BMI: There is no height or weight on file to calculate BMI. GEN: Well nourished, well developed, in no acute distress HEENT: normocephalic, atraumatic Neck: no JVD, carotid bruits, or masses Cardiac: ***RRR; no murmurs, rubs, or gallops, no edema  Respiratory:  clear to auscultation bilaterally, normal work of breathing GI: soft, nontender, nondistended, + BS MS: no deformity or atrophy Skin: warm and dry, no rash Neuro:  Alert and Oriented x 3, Strength and sensation are intact, follows commands Psych: euthymic mood, full affect  Wt Readings from Last 3 Encounters:  07/15/17 171 lb (77.6 kg)  02/10/17 171 lb (77.6 kg)  01/26/17 170 lb (77.1 kg)      Studies/Labs Reviewed:   EKG:  EKG was ordered today  and personally reviewed by me and demonstrates *** EKG was not ordered today.***  Recent Labs: 01/22/2017: BUN 20; Creatinine, Ser 1.10; Hemoglobin 12.4; Platelets 506; Potassium 3.9; Sodium 136   Lipid Panel No results found for: CHOL, TRIG, HDL, CHOLHDL, VLDL, LDLCALC, LDLDIRECT  Additional studies/ records that were reviewed today include: Summarized above.***    ASSESSMENT & PLAN:   1. ***  Disposition: F/u with ***   Medication Adjustments/Labs and Tests Ordered: Current medicines are reviewed at length with the patient today.  Concerns regarding medicines are outlined above. Medication changes, Labs and Tests ordered today are summarized above and listed in the Patient Instructions accessible in Encounters.   Signed, Charlie Pitter, PA-C  09/04/2017 9:54 AM    [facility] Location in [place], [place] [address] Ph: [phone]; Fax [phone]

## 2017-09-28 NOTE — Progress Notes (Deleted)
Psychiatric Initial Adult Assessment   Patient Identification: Kathryn Long MRN:  [id number] Date of Evaluation:  09/28/2017 Referral Source: Rosita Fire, MD Chief Complaint:   Visit Diagnosis: No diagnosis found.  History of Present Illness:   Kathryn Long is a 77 y.o. year old female with a history of hypertension, COPD, who is referred for hallucinations.     Associated Signs/Symptoms: Depression Symptoms:  {DEPRESSION SYMPTOMS:20000} (Hypo) Manic Symptoms:  {BHH MANIC SYMPTOMS:22872} Anxiety Symptoms:  {BHH ANXIETY SYMPTOMS:22873} Psychotic Symptoms:  {BHH PSYCHOTIC SYMPTOMS:22874} PTSD Symptoms: {BHH PTSD SYMPTOMS:22875}  Past Psychiatric History:  Outpatient:  Psychiatry admission:  Previous suicide attempt:  Past trials of medication:  History of violence:   Previous Psychotropic Medications: {YES/NO:21197}  Substance Abuse History in the last 12 months:  {yes no:314532}  Consequences of Substance Abuse: {BHH CONSEQUENCES OF SUBSTANCE ABUSE:22880}  Past Medical History:  Past Medical History:  Diagnosis Date  . Anxiety   . Arthritis   . Bronchitis   . COPD (chronic obstructive pulmonary disease) ([place])   . Depression   . Hypertension     Past Surgical History:  Procedure Laterality Date  . CATARACT EXTRACTION, BILATERAL    . COLONOSCOPY N/A 03/03/2016   Procedure: COLONOSCOPY;  Surgeon: Daneil Dolin, MD;  Location: [facility];  Service: Endoscopy;  Laterality: N/A;  2:45pm  . TOTAL KNEE ARTHROPLASTY Right 2010  . VENTRAL HERNIA REPAIR N/A 01/26/2017   Procedure: HERNIA REPAIR VENTRAL ADULT WITH MESH;  Surgeon: Aviva Signs, MD;  Location: [facility];  Service: General;  Laterality: N/A;    Family Psychiatric History: ***  Family History:  Family History  Problem Relation Age of Onset  . Breast cancer Sister   . Cancer Brother        unknown primary  . Colon cancer Sister        ?age  . Breast cancer Sister     Social History:    Social History   Socioeconomic History  . Marital status: Single    Spouse name: Not on file  . Number of children: Not on file  . Years of education: Not on file  . Highest education level: Not on file  Occupational History  . Occupation: retired  Scientific laboratory technician  . Financial resource strain: Not on file  . Food insecurity:    Worry: Not on file    Inability: Not on file  . Transportation needs:    Medical: Not on file    Non-medical: Not on file  Tobacco Use  . Smoking status: Current Every Day Smoker    Types: Cigarettes    Last attempt to quit: 01/06/2017    Years since quitting: 0.7  . Smokeless tobacco: Never Used  Substance and Sexual Activity  . Alcohol use: No  . Drug use: No  . Sexual activity: Not on file  Lifestyle  . Physical activity:    Days per week: Not on file    Minutes per session: Not on file  . Stress: Not on file  Relationships  . Social connections:    Talks on phone: Not on file    Gets together: Not on file    Attends religious service: Not on file    Active member of club or organization: Not on file    Attends meetings of clubs or organizations: Not on file    Relationship status: Not on file  Other Topics Concern  . Not on file  Social History Narrative  . Not  on file    Additional Social History: ***  Allergies:   Allergies  Allergen Reactions  . Ondansetron Nausea And Vomiting    Made nausea, vomiting worse headache    Metabolic Disorder Labs: No results found for: HGBA1C, MPG No results found for: PROLACTIN No results found for: CHOL, TRIG, HDL, CHOLHDL, VLDL, LDLCALC   Current Medications: Current Outpatient Medications  Medication Sig Dispense Refill  . ALPRAZolam (XANAX) 1 MG tablet Take 1 mg by mouth 2 (two) times daily.  3  . lisinopril (PRINIVIL,ZESTRIL) 10 MG tablet Take 10 mg by mouth daily.     . Multiple Vitamin (MULTIVITAMIN WITH MINERALS) TABS tablet Take 1 tablet by mouth daily. When she remembers     No  current facility-administered medications for this visit.     Neurologic: Headache: No Seizure: No Paresthesias:No  Musculoskeletal: Strength & Muscle Tone: within normal limits Gait & Station: normal Patient leans: N/A  Psychiatric Specialty Exam: ROS  There were no vitals taken for this visit.There is no height or weight on file to calculate BMI.  General Appearance: Fairly Groomed  Eye Contact:  Good  Speech:  Clear and Coherent  Volume:  Normal  Mood:  {BHH MOOD:22306}  Affect:  {Affect (PAA):22687}  Thought Process:  Coherent  Orientation:  Full (Time, Place, and Person)  Thought Content:  Logical  Suicidal Thoughts:  {ST/HT (PAA):22692}  Homicidal Thoughts:  {ST/HT (PAA):22692}  Memory:  Immediate;   Good  Judgement:  {Judgement (PAA):22694}  Insight:  {Insight (PAA):22695}  Psychomotor Activity:  Normal  Concentration:  Concentration: Good and Attention Span: Good  Recall:  Good  Fund of Knowledge:Good  Language: Good  Akathisia:  No  Handed:  Right  AIMS (if indicated):  N/A  Assets:  Communication Skills Desire for Improvement  ADL's:  Intact  Cognition: WNL  Sleep:  ***   Assessment  Plan  The patient demonstrates the following risk factors for suicide: Chronic risk factors for suicide include: {Chronic Risk Factors for XJOITGP:49826415}. Acute risk factors for suicide include: {Acute Risk Factors for AXENMMH:68088110}. Protective factors for this patient include: {Protective Factors for Suicide RPRX:45859292}. Considering these factors, the overall suicide risk at this point appears to be {Desc; low/moderate/high:110033}. Patient {ACTION; IS/IS KMQ:28638177} appropriate for outpatient follow up.   Treatment Plan Summary: Plan as above   Norman Clay, MD 9/23/20194:49 PM

## 2017-10-01 DIAGNOSIS — J449 Chronic obstructive pulmonary disease, unspecified: Secondary | ICD-10-CM | POA: Diagnosis not present

## 2017-10-01 DIAGNOSIS — Z96659 Presence of unspecified artificial knee joint: Secondary | ICD-10-CM | POA: Diagnosis not present

## 2017-10-02 ENCOUNTER — Ambulatory Visit (HOSPITAL_COMMUNITY): Payer: Self-pay | Admitting: Psychiatry

## 2017-10-05 ENCOUNTER — Ambulatory Visit (HOSPITAL_COMMUNITY): Payer: Self-pay | Admitting: Psychiatry

## 2017-11-19 DIAGNOSIS — Z23 Encounter for immunization: Secondary | ICD-10-CM | POA: Diagnosis not present

## 2017-11-19 DIAGNOSIS — F17200 Nicotine dependence, unspecified, uncomplicated: Secondary | ICD-10-CM | POA: Diagnosis not present

## 2017-11-19 DIAGNOSIS — E119 Type 2 diabetes mellitus without complications: Secondary | ICD-10-CM | POA: Diagnosis not present

## 2017-11-19 DIAGNOSIS — J449 Chronic obstructive pulmonary disease, unspecified: Secondary | ICD-10-CM | POA: Diagnosis not present

## 2017-11-19 DIAGNOSIS — F339 Major depressive disorder, recurrent, unspecified: Secondary | ICD-10-CM | POA: Diagnosis not present

## 2018-01-08 DIAGNOSIS — M2550 Pain in unspecified joint: Secondary | ICD-10-CM | POA: Diagnosis not present

## 2018-01-08 DIAGNOSIS — E669 Obesity, unspecified: Secondary | ICD-10-CM | POA: Diagnosis not present

## 2018-01-08 DIAGNOSIS — R5383 Other fatigue: Secondary | ICD-10-CM | POA: Diagnosis not present

## 2018-01-08 DIAGNOSIS — Z6831 Body mass index (BMI) 31.0-31.9, adult: Secondary | ICD-10-CM | POA: Diagnosis not present

## 2018-01-27 ENCOUNTER — Ambulatory Visit (HOSPITAL_COMMUNITY): Payer: Medicare HMO | Admitting: Psychiatry

## 2018-02-15 NOTE — Progress Notes (Deleted)
Psychiatric Initial Adult Assessment   Patient Identification: Kathryn Long MRN:  [id number] Date of Evaluation:  02/15/2018 Referral Source: Rosita Fire, MD Chief Complaint:   Visit Diagnosis: No diagnosis found.  History of Present Illness:   Kathryn Long is a 78 y.o. year old female with a history of COPD, hypertension who is referred for hallucination.      Associated Signs/Symptoms: Depression Symptoms:  {DEPRESSION SYMPTOMS:20000} (Hypo) Manic Symptoms:  {BHH MANIC SYMPTOMS:22872} Anxiety Symptoms:  {BHH ANXIETY SYMPTOMS:22873} Psychotic Symptoms:  {BHH PSYCHOTIC SYMPTOMS:22874} PTSD Symptoms: {BHH PTSD SYMPTOMS:22875}  Past Psychiatric History:  Outpatient:  Psychiatry admission:  Previous suicide attempt:  Past trials of medication:  History of violence:   Previous Psychotropic Medications: {YES/NO:21197}  Substance Abuse History in the last 12 months:  {yes no:314532}  Consequences of Substance Abuse: {BHH CONSEQUENCES OF SUBSTANCE ABUSE:22880}  Past Medical History:  Past Medical History:  Diagnosis Date  . Anxiety   . Arthritis   . Bronchitis   . COPD (chronic obstructive pulmonary disease) ([place])   . Depression   . Hypertension     Past Surgical History:  Procedure Laterality Date  . CATARACT EXTRACTION, BILATERAL    . COLONOSCOPY N/A 03/03/2016   Procedure: COLONOSCOPY;  Surgeon: Daneil Dolin, MD;  [facility];  Service: Endoscopy;  Laterality: N/A;  2:45pm  . TOTAL KNEE ARTHROPLASTY Right 2010  . VENTRAL HERNIA REPAIR N/A 01/26/2017   Procedure: HERNIA REPAIR VENTRAL ADULT WITH MESH;  Surgeon: Aviva Signs, MD;  Location: [facility];  Service: General;  Laterality: N/A;    Family Psychiatric History: ***  Family History:  Family History  Problem Relation Age of Onset  . Breast cancer Sister   . Cancer Brother        unknown primary  . Colon cancer Sister        ?age  . Breast cancer Sister     Social History:    Social History   Socioeconomic History  . Marital status: Single    Spouse name: Not on file  . Number of children: Not on file  . Years of education: Not on file  . Highest education level: Not on file  Occupational History  . Occupation: retired  Scientific laboratory technician  . Financial resource strain: Not on file  . Food insecurity:    Worry: Not on file    Inability: Not on file  . Transportation needs:    Medical: Not on file    Non-medical: Not on file  Tobacco Use  . Smoking status: Current Every Day Smoker    Types: Cigarettes    Last attempt to quit: 01/06/2017    Years since quitting: 1.1  . Smokeless tobacco: Never Used  Substance and Sexual Activity  . Alcohol use: No  . Drug use: No  . Sexual activity: Not on file  Lifestyle  . Physical activity:    Days per week: Not on file    Minutes per session: Not on file  . Stress: Not on file  Relationships  . Social connections:    Talks on phone: Not on file    Gets together: Not on file    Attends religious service: Not on file    Active member of club or organization: Not on file    Attends meetings of clubs or organizations: Not on file    Relationship status: Not on file  Other Topics Concern  . Not on file  Social History Narrative  .  Not on file    Additional Social History: ***  Allergies:   Allergies  Allergen Reactions  . Ondansetron Nausea And Vomiting    Made nausea, vomiting worse headache    Metabolic Disorder Labs: No results found for: HGBA1C, MPG No results found for: PROLACTIN No results found for: CHOL, TRIG, HDL, CHOLHDL, VLDL, LDLCALC No results found for: TSH  Therapeutic Level Labs: No results found for: LITHIUM No results found for: CBMZ No results found for: VALPROATE  Current Medications: Current Outpatient Medications  Medication Sig Dispense Refill  . ALPRAZolam (XANAX) 1 MG tablet Take 1 mg by mouth 2 (two) times daily.  3  . lisinopril (PRINIVIL,ZESTRIL) 10 MG tablet Take 10  mg by mouth daily.     . Multiple Vitamin (MULTIVITAMIN WITH MINERALS) TABS tablet Take 1 tablet by mouth daily. When she remembers     No current facility-administered medications for this visit.     Musculoskeletal: Strength & Muscle Tone: within normal limits Gait & Station: normal Patient leans: N/A  Psychiatric Specialty Exam: ROS  There were no vitals taken for this visit.There is no height or weight on file to calculate BMI.  General Appearance: Fairly Groomed  Eye Contact:  Good  Speech:  Clear and Coherent  Volume:  Normal  Mood:  {BHH MOOD:22306}  Affect:  {Affect (PAA):22687}  Thought Process:  Coherent  Orientation:  Full (Time, Place, and Person)  Thought Content:  Logical  Suicidal Thoughts:  {ST/HT (PAA):22692}  Homicidal Thoughts:  {ST/HT (PAA):22692}  Memory:  Immediate;   Good  Judgement:  {Judgement (PAA):22694}  Insight:  {Insight (PAA):22695}  Psychomotor Activity:  Normal  Concentration:  Concentration: Good and Attention Span: Good  Recall:  Good  Fund of Knowledge:Good  Language: Good  Akathisia:  No  Handed:  Right  AIMS (if indicated):  not done  Assets:  Communication Skills Desire for Improvement  ADL's:  Intact  Cognition: WNL  Sleep:  {BHH GOOD/FAIR/POOR:22877}   Screenings:   Assessment and Plan:  Assessment  Plan  The patient demonstrates the following risk factors for suicide: Chronic risk factors for suicide include: {Chronic Risk Factors for WKGSUPJ:03159458}. Acute risk factors for suicide include: {Acute Risk Factors for PFYTWKM:62863817}. Protective factors for this patient include: {Protective Factors for Suicide RNHA:57903833}. Considering these factors, the overall suicide risk at this point appears to be {Desc; low/moderate/high:110033}. Patient {ACTION; IS/IS XOV:29191660} appropriate for outpatient follow up.   Norman Clay, MD 2/10/20203:14 PM

## 2018-02-22 ENCOUNTER — Ambulatory Visit (HOSPITAL_COMMUNITY): Payer: Medicare HMO | Admitting: Psychiatry

## 2018-03-07 DEATH — deceased

## 2018-04-22 ENCOUNTER — Encounter (HOSPITAL_COMMUNITY): Payer: Self-pay | Admitting: General Surgery

## 2020-01-10 IMAGING — MR MR SHOULDER*R* W/O CM
4 of 5 series · 19 of 40 positions shown · non-contrast
Comparison: Radiographs dated 01/20/2017

CLINICAL DATA: Right shoulder pain.

EXAM:
MRI OF THE RIGHT SHOULDER WITHOUT CONTRAST
TECHNIQUE: Multiplanar, multisequence MR imaging of the shoulder was performed.
No intravenous contrast was administered.

[Series 3: t2fs axial · axial · 3.0mm · 0.31mm/px · z∈[+40,+87]mm · 3 of 20 slices shown]
[im 3/20]
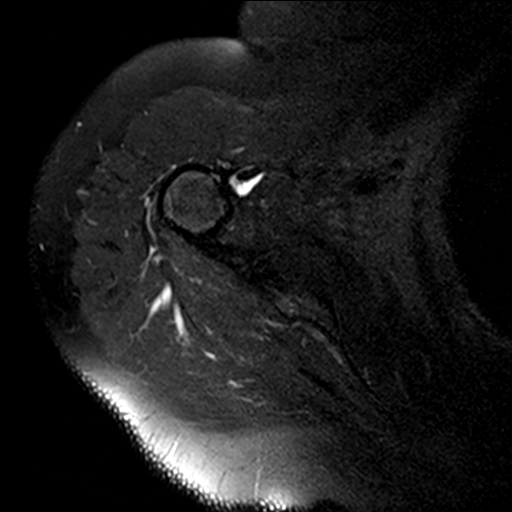
[im 11/20]
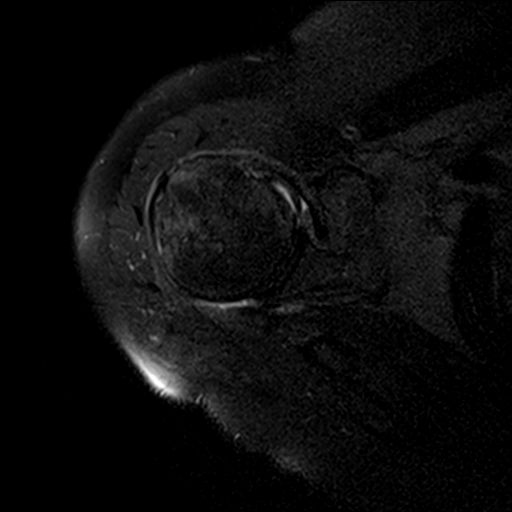
[im 17/20]
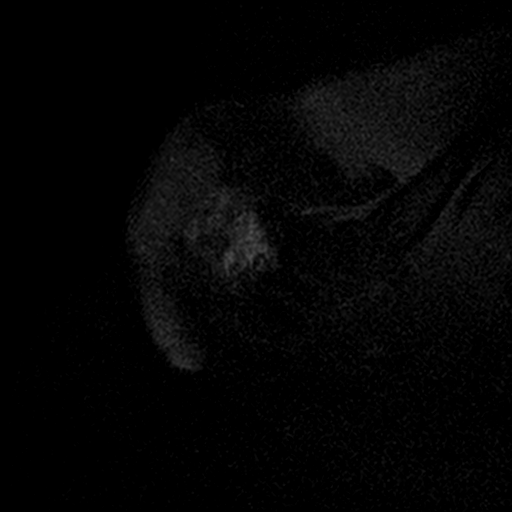

[Series 4: t2fs coronal · sagittal · 3.0mm · 0.28mm/px · 3 of 20 slices shown]
[im 4/20]
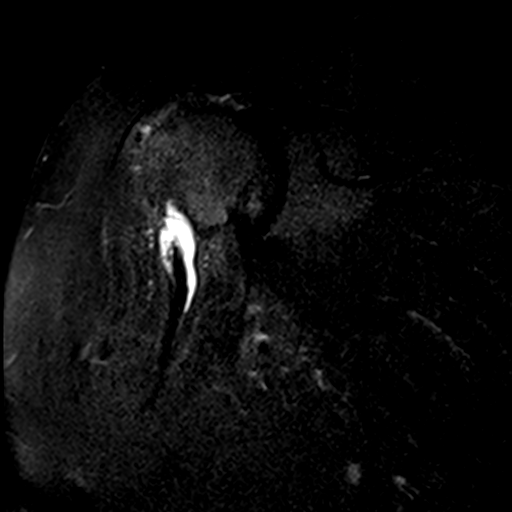
[im 10/20]
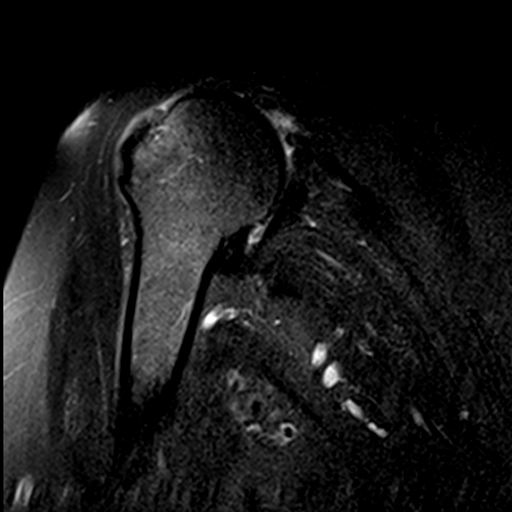
[im 16/20]
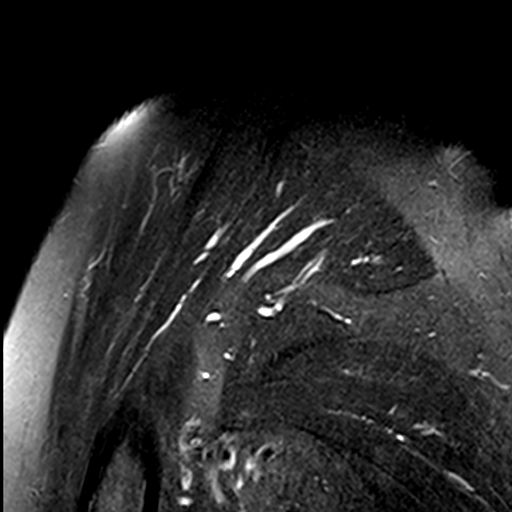

[Series 5: PD · sagittal · 3.0mm · 0.28mm/px · 7 of 20 slices shown]
[im 1/20]
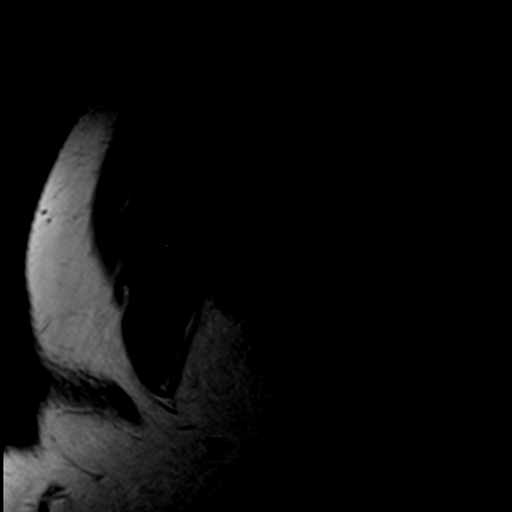
[im 4/20]
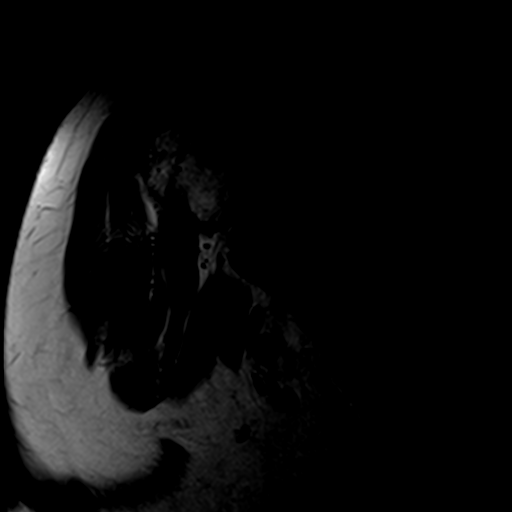
[im 7/20]
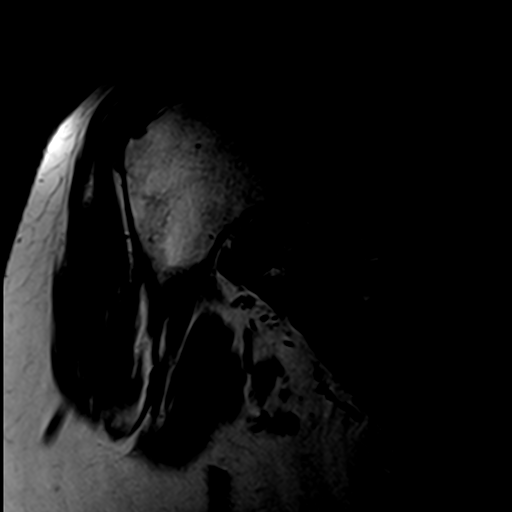
[im 10/20]
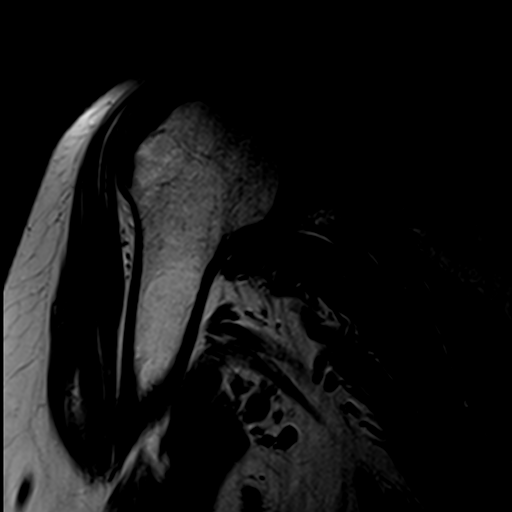
[im 13/20]
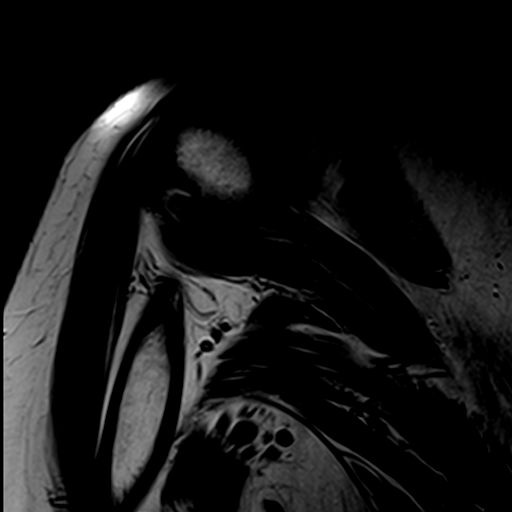
[im 16/20]
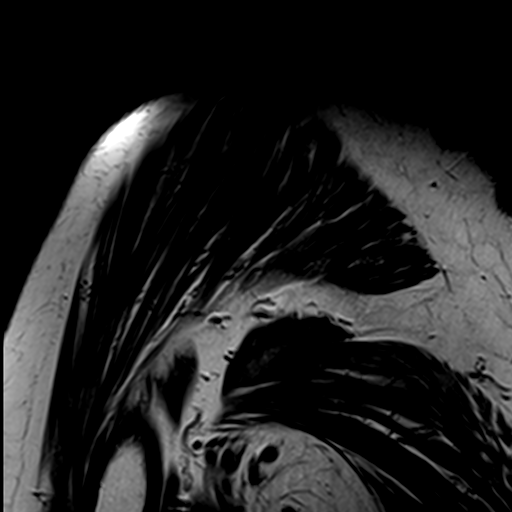
[im 20/20]
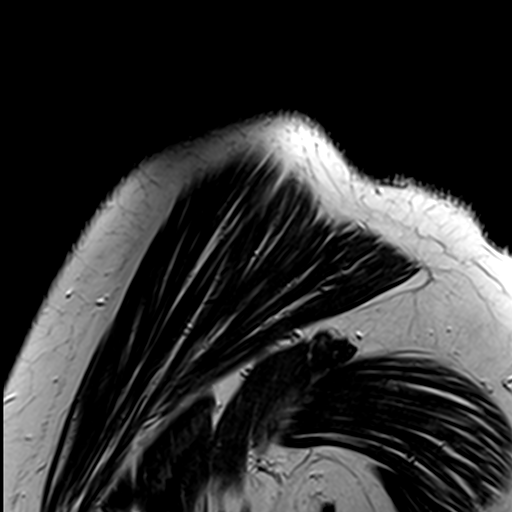

[Series 6: T1 · oblique · 3.0mm · 0.26mm/px · 6 of 24 slices shown]
[im 1/24]
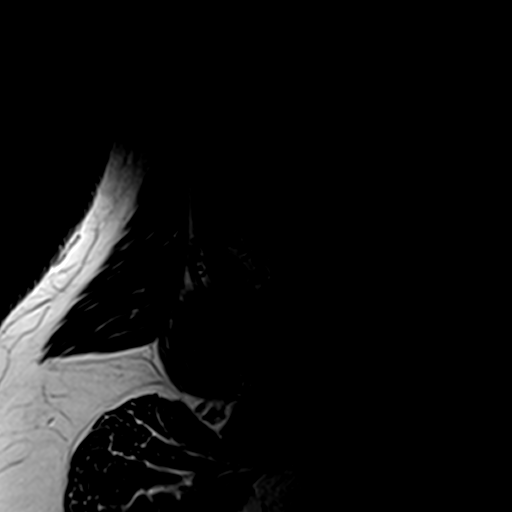
[im 3/24]
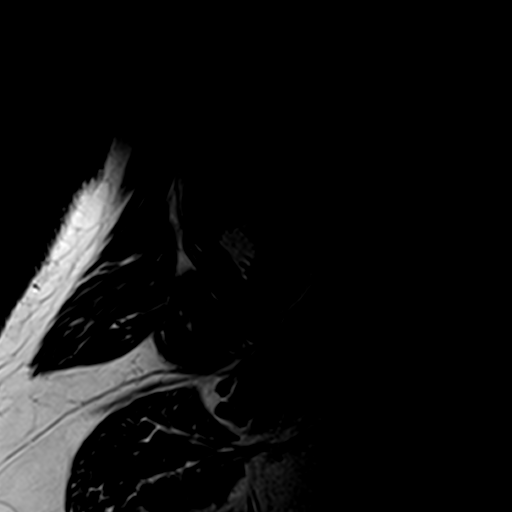
[im 6/24]
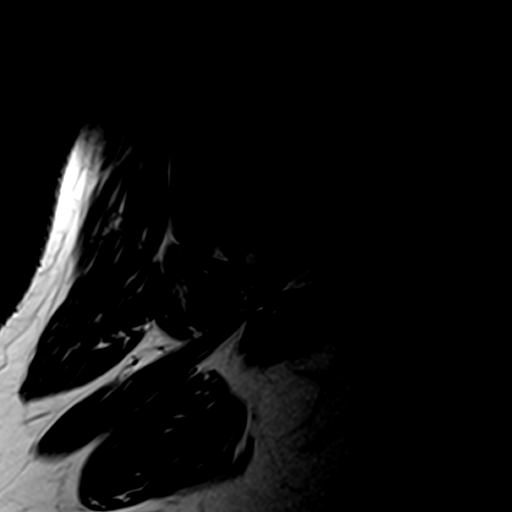
[im 9/24]
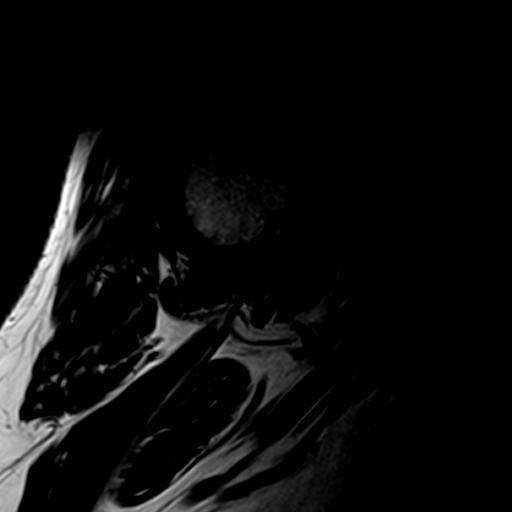
[im 12/24]
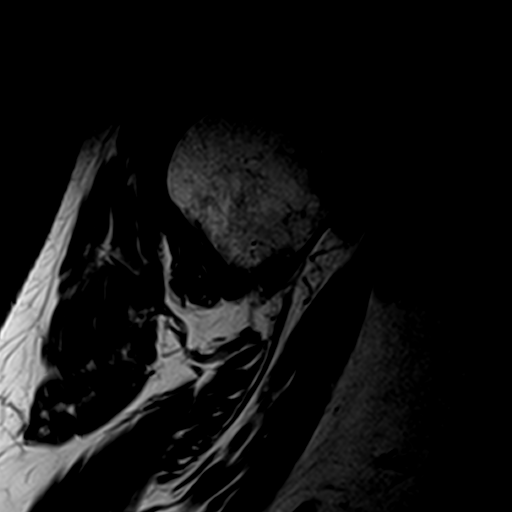
[im 21/24]
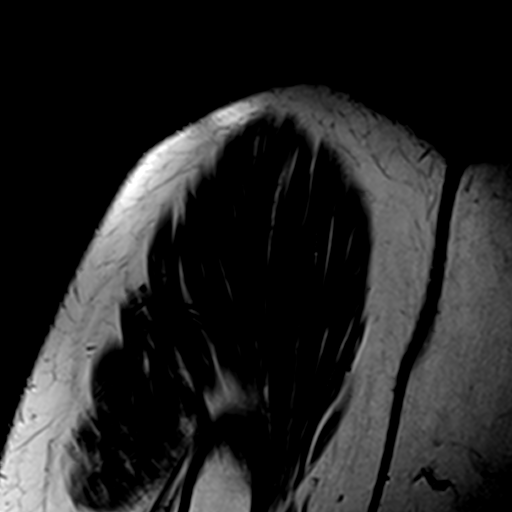

[19 of 40 positions shown; findings below may reference images not displayed]

FINDINGS: Rotator cuff: There is a 4 cm diameter full-thickness retracted tear
involving the supraspinatus and infraspinatus tendons. There is a
partial-thickness articular surface tear of the superior aspect of
the subscapularis tendon. Teres minor tendon is intact.

Muscles: Severe atrophy of the supraspinatus and infraspinatus
muscles and moderate atrophy of the subscapularis muscle.

Biceps long head: Properly located. The intra-articular portion of
the long head of the biceps tendon is severely attenuated consistent
with a partial longitudinal tear.

Acromioclavicular Joint: Moderate AC joint hypertrophy. Inferior
osteophytes on the distal clavicle and acromion could predispose to
impingement. Type 3 acromion.

Glenohumeral Joint: No significant effusion.  No cartilage defect.

Labrum:  Intact.

Bones:  No significant abnormalities.

Other: None
IMPRESSION: 1. Large full-thickness retracted tears of the infraspinatus and
supraspinatus tendons.
2. Partial-thickness articular surface tear of the superior fibers
of the subscapularis tendon.
3. AC joint arthropathy with type 3 acromion which could predispose
to impingement.
4. Partial tear of the long head of the biceps tendon.
